# Patient Record
Sex: Male | Born: 1955 | Race: White | Hispanic: No | Smoking: Never smoker
Health system: Southern US, Community
[De-identification: ages and names within clinical notes are randomized; demographics above are authoritative.]

## PROBLEM LIST (undated history)

## (undated) DIAGNOSIS — I10 Essential (primary) hypertension: Secondary | ICD-10-CM

## (undated) DIAGNOSIS — G44209 Tension-type headache, unspecified, not intractable: Secondary | ICD-10-CM

## (undated) DIAGNOSIS — E669 Obesity, unspecified: Secondary | ICD-10-CM

## (undated) DIAGNOSIS — K760 Fatty (change of) liver, not elsewhere classified: Secondary | ICD-10-CM

## (undated) HISTORY — PX: CERVICAL SPINE SURGERY: SHX589

## (undated) HISTORY — DX: Fatty (change of) liver, not elsewhere classified: K76.0

## (undated) HISTORY — DX: Obesity, unspecified: E66.9

## (undated) HISTORY — DX: Tension-type headache, unspecified, not intractable: G44.209

## (undated) HISTORY — PX: BACK SURGERY: SHX140

## (undated) HISTORY — PX: CHOLECYSTECTOMY: SHX55

## (undated) HISTORY — PX: OTHER SURGICAL HISTORY: SHX169

---

## 2005-05-25 ENCOUNTER — Ambulatory Visit: Payer: Self-pay | Admitting: Family Medicine

## 2005-08-21 ENCOUNTER — Emergency Department (HOSPITAL_COMMUNITY): Admission: EM | Admit: 2005-08-21 | Discharge: 2005-08-21 | Payer: Self-pay | Admitting: Emergency Medicine

## 2007-03-07 ENCOUNTER — Emergency Department (HOSPITAL_COMMUNITY): Admission: EM | Admit: 2007-03-07 | Discharge: 2007-03-08 | Payer: Self-pay | Admitting: Emergency Medicine

## 2009-05-20 ENCOUNTER — Ambulatory Visit (HOSPITAL_COMMUNITY): Admission: RE | Admit: 2009-05-20 | Discharge: 2009-05-20 | Payer: Self-pay | Admitting: Internal Medicine

## 2009-06-13 ENCOUNTER — Ambulatory Visit (HOSPITAL_COMMUNITY): Admission: RE | Admit: 2009-06-13 | Discharge: 2009-06-14 | Payer: Self-pay | Admitting: Neurosurgery

## 2009-07-19 ENCOUNTER — Inpatient Hospital Stay (HOSPITAL_COMMUNITY): Admission: RE | Admit: 2009-07-19 | Discharge: 2009-07-21 | Payer: Self-pay | Admitting: Neurosurgery

## 2009-11-08 ENCOUNTER — Encounter: Payer: Self-pay | Admitting: Orthopedic Surgery

## 2009-11-08 ENCOUNTER — Emergency Department (HOSPITAL_COMMUNITY): Admission: EM | Admit: 2009-11-08 | Discharge: 2009-11-08 | Payer: Self-pay | Admitting: Emergency Medicine

## 2009-11-11 ENCOUNTER — Ambulatory Visit: Payer: Self-pay | Admitting: Orthopedic Surgery

## 2009-11-11 ENCOUNTER — Encounter (INDEPENDENT_AMBULATORY_CARE_PROVIDER_SITE_OTHER): Payer: Self-pay | Admitting: *Deleted

## 2009-11-11 DIAGNOSIS — S52023A Displaced fracture of olecranon process without intraarticular extension of unspecified ulna, initial encounter for closed fracture: Secondary | ICD-10-CM | POA: Insufficient documentation

## 2009-11-11 DIAGNOSIS — IMO0002 Reserved for concepts with insufficient information to code with codable children: Secondary | ICD-10-CM | POA: Insufficient documentation

## 2009-11-15 ENCOUNTER — Telehealth: Payer: Self-pay | Admitting: Orthopedic Surgery

## 2009-11-27 ENCOUNTER — Ambulatory Visit: Payer: Self-pay | Admitting: Orthopedic Surgery

## 2010-02-18 ENCOUNTER — Ambulatory Visit: Payer: Self-pay | Admitting: Orthopedic Surgery

## 2010-02-18 DIAGNOSIS — M7020 Olecranon bursitis, unspecified elbow: Secondary | ICD-10-CM | POA: Insufficient documentation

## 2010-03-12 ENCOUNTER — Ambulatory Visit: Payer: Self-pay | Admitting: Orthopedic Surgery

## 2010-09-09 NOTE — Letter (Signed)
Summary: History form  History form   Imported By: Jacklynn Ganong 11/13/2009 09:03:49  _____________________________________________________________________  External Attachment:    Type:   Image     Comment:   External Document

## 2010-09-09 NOTE — Progress Notes (Signed)
Summary: swelling of the elbow  Phone Note Call from Patient Call back at Home Phone [phone]   Summary of Call: called stating he noticed more bruising and swelling, no increase in pain, I advised to take Ibuprofen 600mg  every 8 hrs, he is taking Percocet for pain, use ice, elevation also, advised bruising will go away, this is normal, he is using sling as needed. Initial call taken by: Ether Griffins,  November 15, 2009 10:02 AM

## 2010-09-09 NOTE — Letter (Signed)
Summary: Out of Work  [facility]  [address]  [place], [place] [address]   Phone: [phone]  Fax: [phone]    November 11, 2009   Employee:  Duane Pratt    To Whom It May Concern:   For Medical reasons, please excuse the above named employee from work for the following dates:  Start:   November 11, 2009  End:   Dec 09, 2009  Estimated Return to Work Date:  Dec 10, 2009          (Next appointment:  November 25, 2009)  If you need additional information, please feel free to contact our office.         Sincerely,    Terrance Mass, MD

## 2010-09-09 NOTE — Assessment & Plan Note (Signed)
Summary: still having shoulder pain/frs   Visit Type:  Follow-up Referring Provider:  [facility]  CC:  right elbow pain.  History of Present Illness: 55 year old male status post injury to the RIGHT elbow treated with conservative management presents back for continued RIGHT elbow pain and RIGHT elbow swelling  Also has some LEFT elbow tenderness over the olecranon  The pain is radiating from his RIGHT elbow into her shoulder and into his wrist  His large fluid collection over the RIGHT elbow.   he is status post cervical disc surgery  Pain med is Flexeril 10 and Oxycodone 5 no relief for elbow, takes for back.  examination reveals a large swollen bursal sac with tenderness at the olecranon this is noted on the LEFT side but no bursal sac is noted  He has full range of motion and strength of his RIGHT elbow negative Tinel's over the ulnar nerve the elbow remained stable.  He has no sensory deficits in the RIGHT hand is awake alert and oriented x3 his mood and affect is normal his skin is not read.  He has no lymphadenopathy in the RIGHT side.  He has good pulses perfusion to the RIGHT upper extremity  His overall appearance was normal    Allergies: No Known Drug Allergies   Impression & Recommendations:  Problem # 1:  OLECRANON BURSITIS, RIGHT (ICD-726.33) Assessment New  olecranon bursitis RIGHT elbow  Recommend aspiration injection which was done  The  elbow was prepped with alcohol and anesthetized with ethyl chloride. 40 mg of Depo-Medrol and 5 cc of 1% lidocaine were injected along the lateral epicondyle. No complications. Verbal consent was obtained prior to injection. After aspirating 8 cc of bloody fluid from the RIGHT elbow bursal sac we then performed an injection  Patient's arm was wrapped in an Ace bandage  Orders: Est. Patient Level III ([address]) Joint Aspirate / Injection, Intermediate (60454) Depo- Medrol 40mg  (J1030)  Problem # 2:  CLOSED FRACTURE OF  OLECRANON PROCESS OF ULNA (ICD-813.01) Assessment: Comment Only  resolved  Orders: Est. Patient Level III ([address]11)  Patient Instructions: 1)  You have received an injection of cortisone today. You may experience increased pain at the injection site. Apply ice pack to the area for 20 minutes every 2 hours and take 2 xtra strength tylenol every 8 hours. This increased pain will usually resolve in 24 hours. The injection will take effect in 3-10 days.   2)  wear ace wrap for 72 hours  3)  try to keep pressure off the elbows 4)  Please schedule a follow-up appointment in 3 weeks.

## 2010-09-09 NOTE — Assessment & Plan Note (Signed)
Summary: 2 WK RE-CK RT ELBOW/?XRAY/[facility]   Visit Type:  Follow-up Referring Provider:  [facility]  CC:  right elbow.  History of Present Illness: I saw Duane Pratt in the office today for a 2 week  followup visit.  He is a 55 years old man with the complaint of:  RIGHT elbow injury.  He fractured the olecranon spur.  Had a lot of bruising and swelling.  That is now a lot better.  He says he is 90% of his movement back.  His other complaints include pain and radiation into the RIGHT shoulder when he sneezes otherwise shoulder is normal.  He is status post cervical spine surgery and is in the rehabilitation.  For that  He also asked me about his LEFT elbow as it has a spur and it started to bother him a little bit as well  Exam shows he has full extension.  He has grade 5 strength in extension for the triceps on the RIGHT.  His swelling and skin is a lot better.  He has full flexion.  His elbow is stable.  His LEFT elbow as a tender spur there as well  He has full range of motion of the shoulder mild impingement sign and good strength in the supraspinatus tendon.  Impression fracture of olecranon spur RIGHT elbow, spur LEFT elbow, mild impingement RIGHT shoulder, status post cervical fusion.    Allergies: No Known Drug Allergies   Impression & Recommendations:  Problem # 1:  CLOSED FRACTURE OF OLECRANON PROCESS OF ULNA (ICD-813.01) Assessment Improved  Orders: Est. Patient Level III (60630)  Patient Instructions: 1)  Remove sling and ace wrap  2)  continue exercises for the elbow  3)  f/u as needed; 4)   use ice for swelling

## 2010-09-09 NOTE — Assessment & Plan Note (Signed)
Summary: ER/fx elbow/frs   Vital Signs:  Patient profile:   55 year old male Pulse rate:   88 / minute Resp:     16 per minute  Visit Type:  Initial Consult Referring Provider:  [facility]  CC:  fracture right elbow.  History of Present Illness: I saw Duane Pratt in the office today for an initial visit.  He is a 55 years old man with the complaint of:  fracture right elbow.  ER 11-08-09. Xrays were taken.  DOI 11-08-09.  The patient has had neck and back surgery in the last 6 or 8 months.  He went to look at a treadmill return to treadmill on it when on full speed and he twisted his elbow never fell.  He has olecranon spur fracture and probably a sprain to his lateral collateral ligament space on the amount of swelling he has in the arm with a relatively normal-looking x-ray except for the soft tissue swelling and the fracture line along the olecranon spur  His pain is 8/10 it happened suddenly associated with bruising swelling and sharp. Medications: Aspirin, Vitamin C, Vitamin E, Percocet 5 given by the ER.   Allergies (verified): No Known Drug Allergies  Past History:  Past Surgical History: Cholecystectomy Back surgery Neck surgery  Review of Systems Constitutional:  Denies weight loss, weight gain, fever, chills, and fatigue. Cardiovascular:  Denies chest pain, palpitations, fainting, and murmurs. Respiratory:  Denies short of breath, wheezing, couch, tightness, pain on inspiration, and snoring . Gastrointestinal:  Denies heartburn, nausea, vomiting, diarrhea, constipation, and blood in your stools. Genitourinary:  Denies frequency, urgency, difficulty urinating, painful urination, flank pain, and bleeding in urine. Neurologic:  Complains of numbness and tingling; denies unsteady gait, dizziness, tremors, and seizure. Musculoskeletal:  Complains of joint pain, swelling, and muscle pain; denies instability, stiffness, redness, and heat. Endocrine:  Denies excessive  thirst, exessive urination, and heat or cold intolerance. Psychiatric:  Denies nervousness, depression, anxiety, and hallucinations. Skin:  Complains of changes in the skin; denies poor healing, rash, itching, and redness. HEENT:  Denies blurred or double vision, eye pain, redness, and watering. Immunology:  Denies seasonal allergies, sinus problems, and allergic to bee stings. Hemoatologic:  Denies easy bleeding and brusing.  Physical Exam  Additional Exam:  vital signs are normal.  Gen. appearance of the patient was normal.  Pulses and perfusion in the RIGHT upper extremity normal  Skin was warm dry and intact no rashes but significant ecchymosis in the brachial area as well as the forearm.  Sensation was normal he was awake and alert his mood is excellent.  There is tenderness on the lateral and posterior sides of the elbow no medial tenderness.  His range of motion was 45.  The elbow was stable as could best be tested motor exam was normal    Impression & Recommendations:  Problem # 1:  ELBOW SPRAIN, RIGHT (ICD-841.9) Assessment New  lateral ligament sprain  Orders: New Patient Level II (84132)  Problem # 2:  CLOSED FRACTURE OF OLECRANON PROCESS OF ULNA (ICD-813.01) Assessment: New  this fracture is really of the olecranon spur  Orders: New Patient Level II (44010)  Patient Instructions: 1)  WRAP ACE WRAP ON THE ELBOW FOR 2 WEEKS  2)  SLING AS NEEDED  3)  25 X 3 TIMES A DAY MOVE THE ELBOW  4)  RETURN IN 2 WEEKS

## 2010-09-09 NOTE — Assessment & Plan Note (Signed)
Summary: 3 WK RE-CK/ ELBOW/FOL'G INJEC/[facility]   Visit Type:  Follow-up Referring Provider:  [facility]  CC:  recheck elbow..  History of Present Illness: 55 year old male status post injury to the RIGHT elbow treated with conservative management presents back for continued RIGHT elbow pain and RIGHT elbow swelling  Also has some LEFT elbow tenderness over the olecranon  The pain is radiating from his RIGHT elbow into the  shoulder and into his wrist  Has large fluid collection over the RIGHT elbow.  he is status post cervical disc surgery  Pain med is Endocet 10/325 for back pain.   Today is 3 week recheck on elbow after injection and ace wrap, doing better.     Allergies: No Known Drug Allergies  Physical Exam  Additional Exam:  last examination  examination reveals a large swollen bursal sac with tenderness at the olecranon this is noted on the LEFT side but no bursal sac is noted  He has full range of motion and strength of his RIGHT elbow negative Tinel's over the ulnar nerve the elbow remained stable.  He has no sensory deficits in the RIGHT hand is awake alert and oriented x3 his mood and affect is normal his skin is not read.  He has no lymphadenopathy in the RIGHT side.  He has good pulses perfusion to the RIGHT upper extremity   Today's examination reveals tenderness directly over the RIGHT biceps tendon insertion bursal sac is resolved good confrontational strength in extension   Impression & Recommendations:  Problem # 1:  OLECRANON BURSITIS, RIGHT (ICD-726.33) Assessment Improved  Orders: Est. Patient Level II ([address])  Patient Instructions: 1)  apply ASPERCREME  to right elbow three times a day  2)  Please schedule a follow-up appointment as needed.

## 2010-09-22 ENCOUNTER — Ambulatory Visit (INDEPENDENT_AMBULATORY_CARE_PROVIDER_SITE_OTHER): Payer: Self-pay | Admitting: Internal Medicine

## 2010-11-11 LAB — CBC
MCHC: 34.3 g/dL (ref 30.0–36.0)
RBC: 5.17 MIL/uL (ref 4.22–5.81)
RDW: 13.3 % (ref 11.5–15.5)

## 2010-11-11 LAB — BASIC METABOLIC PANEL
BUN: 13 mg/dL (ref 6–23)
Calcium: 9.7 mg/dL (ref 8.4–10.5)
Creatinine, Ser: 0.74 mg/dL (ref 0.4–1.5)
GFR calc non Af Amer: >60 mL/min (ref >60)
Glucose, Bld: 99 mg/dL (ref 70–99)
Potassium: 4.2 mEq/L (ref 3.5–5.1)

## 2010-11-11 LAB — TYPE AND SCREEN
ABO/RH(D): A POS
Antibody Screen: NEGATIVE

## 2010-11-12 LAB — COMPREHENSIVE METABOLIC PANEL
Alkaline Phosphatase: 61 U/L (ref 39–117)
BUN: 26 mg/dL — ABNORMAL HIGH (ref 6–23)
Calcium: 9 mg/dL (ref 8.4–10.5)
Chloride: 97 mEq/L (ref 96–112)
Creatinine, Ser: 0.85 mg/dL (ref 0.4–1.5)
GFR calc Af Amer: >60 mL/min (ref >60)
GFR calc non Af Amer: >60 mL/min (ref >60)
Glucose, Bld: 93 mg/dL (ref 70–99)

## 2010-11-12 LAB — CBC
MCHC: 35.1 g/dL (ref 30.0–36.0)
WBC: 6.8 10*3/uL (ref 4.0–10.5)

## 2011-01-26 ENCOUNTER — Ambulatory Visit (HOSPITAL_COMMUNITY)
Admission: RE | Admit: 2011-01-26 | Discharge: 2011-01-26 | Disposition: A | Discharge status: Home / Self Care | Payer: 59 | Source: Ambulatory Visit | Attending: Internal Medicine | Admitting: Internal Medicine

## 2011-01-26 ENCOUNTER — Other Ambulatory Visit (HOSPITAL_COMMUNITY): Payer: Self-pay | Admitting: Internal Medicine

## 2011-01-26 DIAGNOSIS — R51 Headache: Secondary | ICD-10-CM | POA: Insufficient documentation

## 2011-01-26 DIAGNOSIS — R4789 Other speech disturbances: Secondary | ICD-10-CM | POA: Insufficient documentation

## 2011-01-26 DIAGNOSIS — R42 Dizziness and giddiness: Secondary | ICD-10-CM | POA: Insufficient documentation

## 2011-04-05 IMAGING — RF DG LUMBAR SPINE 2-3V
1 series · 2 of 2 positions shown · non-contrast
Comparison: Intraoperative localizing lateral lumbar spine earlier
in the [AGE] hours. MRI lumbar spine 05/20/2009.

CLINICAL DATA: L5-S1 spondylolisthesis.  PLIF.

INTRAOPERATIVE LUMBAR SPINE - 2-3 VIEW [DATE]/4222 2244 hours:

[Series 1: run · 2 of 2 slices shown]
[im 1/2]
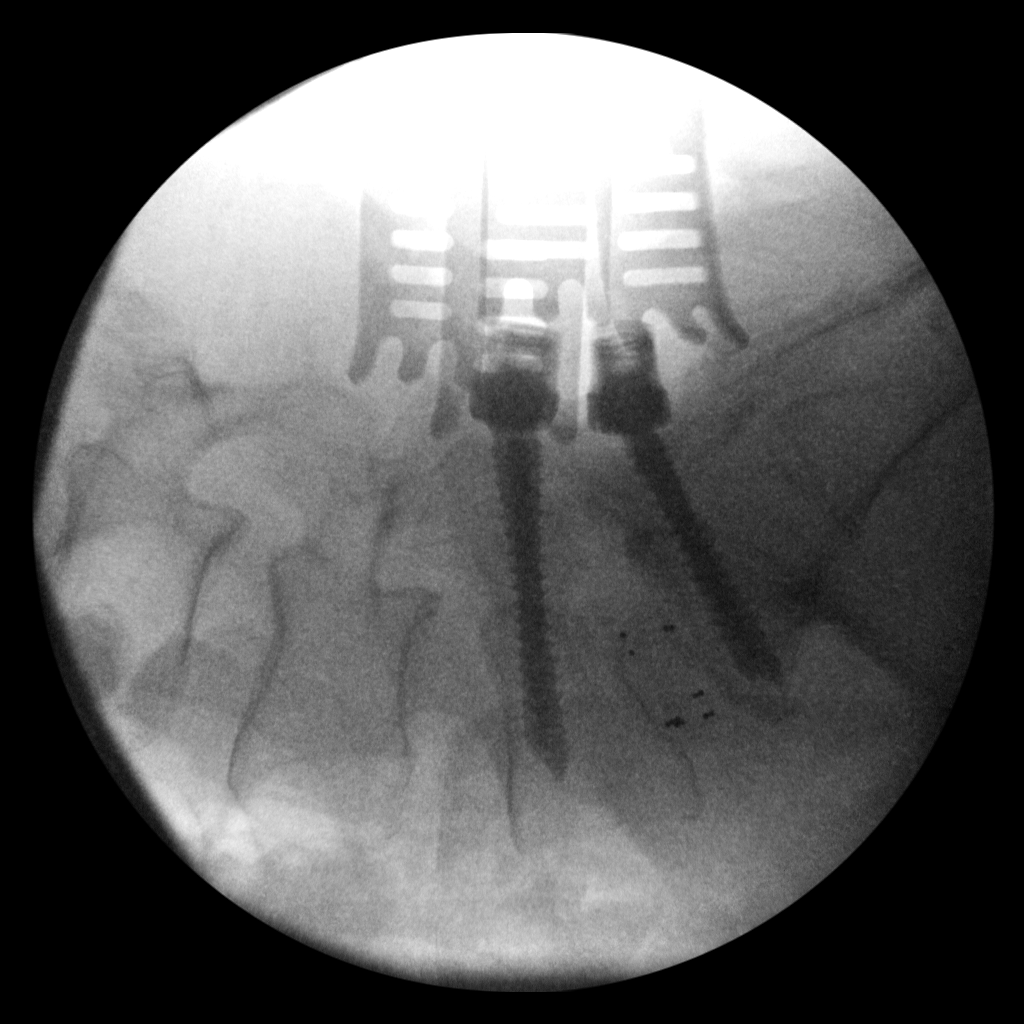
[im 2/2]
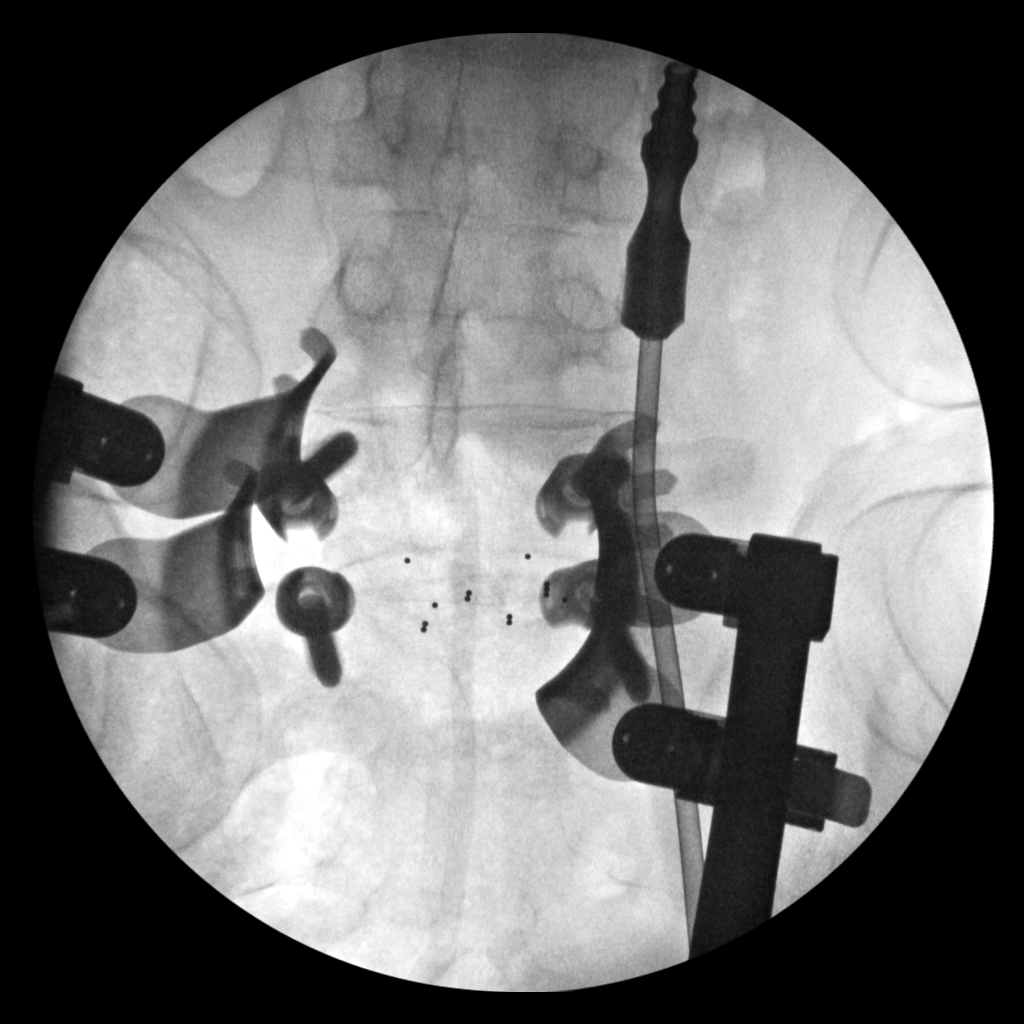

[2 of 2 positions shown; findings below may reference images not displayed]

FINDINGS: 2 spot images from the C-arm fluoroscopic device, AP and
lateral views of the lower lumbar spine, were submitted for
interpretation post-operatively.  Bilateral pedicle screws are
present at L5 and S1.  Interbody fusion plugs are in appropriate
position at L5-S1.
IMPRESSION: Images obtained during L5-S1 PLIF without visible complicating
features.

## 2011-04-06 ENCOUNTER — Encounter: Payer: Self-pay | Admitting: Emergency Medicine

## 2011-04-06 ENCOUNTER — Emergency Department (HOSPITAL_COMMUNITY)
Admission: EM | Admit: 2011-04-06 | Discharge: 2011-04-06 | Disposition: A | Discharge status: Home / Self Care | Payer: 59 | Attending: Emergency Medicine | Admitting: Emergency Medicine

## 2011-04-06 DIAGNOSIS — I10 Essential (primary) hypertension: Secondary | ICD-10-CM | POA: Insufficient documentation

## 2011-04-06 DIAGNOSIS — R51 Headache: Secondary | ICD-10-CM | POA: Insufficient documentation

## 2011-04-06 HISTORY — DX: Essential (primary) hypertension: I10

## 2011-04-06 NOTE — ED Notes (Signed)
Pt says he has hard pressure off and on for a few weeks, said he advised physician at headache clinic but got scared and came to the ED.  Pt says the pressure has increased dramatically in the last 48 hours.  Pt has had a headache for the last 3 months and says he loses his ability to speak clearly when the pressure increases.  Pt says pressure increases when he lays down.  Says the pressure is worse now while his head is touching the bed.

## 2011-04-06 NOTE — ED Provider Notes (Signed)
History     CSN: [id number] Arrival date & time: 04/06/2011  4:05 AM Seen at 0420am Chief Complaint  Patient presents with  . Headache    Pt is being seen at the headache clinic, says he now has increased pressure in back of head and neck.   HPI Comments: Several month h/o HA, seen at headache clinic, has had recent CT imaging Reports he gets facial/scalp "injections" at headaches clinic Reports HA has been constant for months Reports worsened tonight, and wanted to be evaluated to see if any further testing is necessary No fever No falls No tick bites  Patient is a 55 y.o. male presenting with headaches. The history is provided by the patient.  Headache  This is a chronic problem. Episode onset: several months ago. The problem occurs constantly. The problem has been gradually worsening. Pain location: throughout his head, and pressure from his neck. The pain is moderate. Associated symptoms include nausea. Pertinent negatives include no fever, no chest pressure, no palpitations, no syncope, no shortness of breath and no vomiting. Associated symptoms comments: No visual changes No hearing loss No vertigo No focal weakness No falls No cp/sob Reports when pressure worsens, he has difficulty speaking but this has been present for weeks and has seen his physician for this.    Past Medical History  Diagnosis Date  . Hypertension     Past Surgical History  Procedure Date  . Back surgery   . Cholecystectomy     History reviewed. No pertinent family history.  History  Substance Use Topics  . Smoking status: Not on file  . Smokeless tobacco: Never Used  . Alcohol Use: No      Review of Systems  Constitutional: Negative for fever.  Respiratory: Negative for shortness of breath.   Cardiovascular: Negative for palpitations and syncope.  Gastrointestinal: Positive for nausea. Negative for vomiting.  Neurological: Positive for headaches.  All other systems reviewed and are  negative.    Physical Exam  BP 148/107  Pulse 98  Temp(Src) 97.8 F (36.6 C) (Oral)  Resp 14  Ht 5\' 11"  (1.803 m)  Wt 210 lb (95.255 kg)  BMI 29.29 kg/m2  SpO2 97%  Physical Exam  CONSTITUTIONAL: Well developed/well nourished HEAD AND FACE: Normocephalic/atraumatic EYES: EOMI/PERRL, normal appearance ENMT: Mucous membranes moist NECK: supple no meningeal signs, no carotid bruit CV: S1/S2 noted, no murmurs/rubs/gallops noted LUNGS: Lungs are clear to auscultation bilaterally, no apparent distress ABDOMEN: soft, nontender, no rebound or guarding NEURO: Awake/alert, facies symmetric, no arm or leg drift is noted Cranial nerves 3/4/5/6/02/15/09/11/12 tested and intact No ataxia No pastpointing No nystagmus EXTREMITIES: pulses normal, full ROM SKIN: warm, color normal PSYCH: no abnormalities of mood noted   ED Course  Procedures  MDM Nursing notes reviewed and considered in documentation Previous records reviewed and considered  I offered pt medicines.  He refuses, reports his physician instructed him not to take any other meds.  He will f/u later today with his specialist.  He would like to go home.  No further workup necessary given history of HA for months No dysarthria is noted on my exam, speech clear     Joya Gaskins, MD 04/06/11 [phone]

## 2011-04-07 ENCOUNTER — Other Ambulatory Visit: Payer: Self-pay | Admitting: Specialist

## 2011-04-07 DIAGNOSIS — R519 Headache, unspecified: Secondary | ICD-10-CM

## 2011-04-08 ENCOUNTER — Other Ambulatory Visit: Payer: Self-pay | Admitting: Specialist

## 2011-04-08 DIAGNOSIS — R519 Headache, unspecified: Secondary | ICD-10-CM

## 2011-04-14 ENCOUNTER — Ambulatory Visit
Admission: RE | Admit: 2011-04-14 | Discharge: 2011-04-14 | Disposition: A | Discharge status: Home / Self Care | Payer: 59 | Source: Ambulatory Visit | Attending: Specialist | Admitting: Specialist

## 2011-04-14 ENCOUNTER — Other Ambulatory Visit: Payer: Self-pay | Admitting: Specialist

## 2011-04-14 VITALS — BP 125/73 | HR 86

## 2011-04-14 DIAGNOSIS — R519 Headache, unspecified: Secondary | ICD-10-CM

## 2011-04-14 NOTE — Progress Notes (Signed)
Two tiger-top tubes of blood drawn from right [facility] space w/o difficulty for 4ml serum per LP orders.  jkl

## 2011-05-13 LAB — FUNGUS CULTURE W SMEAR: Smear Result: NONE SEEN

## 2011-05-25 LAB — URINALYSIS, ROUTINE W REFLEX MICROSCOPIC
Hgb urine dipstick: NEGATIVE
Ketones, ur: NEGATIVE
Leukocytes, UA: NEGATIVE
Protein, ur: 30 — AB
Urobilinogen, UA: 0.2

## 2011-05-25 LAB — DIFFERENTIAL
Basophils Absolute: 0
Basophils Relative: 0
Eosinophils Absolute: 0.1
Neutro Abs: 6
Neutrophils Relative %: 73

## 2011-05-25 LAB — COMPREHENSIVE METABOLIC PANEL
Alkaline Phosphatase: 64
BUN: 16
CO2: 29
Chloride: 100
Glucose, Bld: 141 — ABNORMAL HIGH
Potassium: 4.3
Total Bilirubin: 1

## 2011-05-25 LAB — URINE MICROSCOPIC-ADD ON

## 2011-05-25 LAB — CBC
HCT: 45.5
Hemoglobin: 15.6
RBC: 5.36
WBC: 8.3

## 2011-05-25 LAB — POCT CARDIAC MARKERS: Troponin i, poc: <0.05

## 2012-12-12 ENCOUNTER — Ambulatory Visit (INDEPENDENT_AMBULATORY_CARE_PROVIDER_SITE_OTHER): Payer: BC Managed Care – PPO | Admitting: Neurology

## 2012-12-12 ENCOUNTER — Encounter: Payer: Self-pay | Admitting: Neurology

## 2012-12-12 VITALS — BP 129/89 | HR 97 | Temp 97.6°F | Ht 70.5 in | Wt 254.0 lb

## 2012-12-12 DIAGNOSIS — R1310 Dysphagia, unspecified: Secondary | ICD-10-CM

## 2012-12-12 DIAGNOSIS — M62838 Other muscle spasm: Secondary | ICD-10-CM

## 2012-12-12 DIAGNOSIS — R259 Unspecified abnormal involuntary movements: Secondary | ICD-10-CM

## 2012-12-12 DIAGNOSIS — R51 Headache: Secondary | ICD-10-CM

## 2012-12-12 MED ORDER — BUSPIRONE HCL 15 MG PO TABS: 15.0000 mg | ORAL_TABLET | Freq: Three times a day (TID) | ORAL | Status: AC

## 2012-12-12 NOTE — Progress Notes (Addendum)
Subjective:    Patient ID: Duane Pratt is a 57 y.o. male.  HPI  Interim history:  Duane Pratt is a 57 yo RH gentleman, who presents for FU consultation of abnormal involuntary facial movements, including twitching and abnormal eye closure intermittently. The patient is unaccompanied today and was last seen by me on 09/14/12, at which time I talked to him about his recent sleep study and I suggested a trial of Buspar to reduce the associated anxiety.  I first met him on 08/29/12, and I had suggested a NPSG, as he reported waking up multiple times in the middle of the night secondary to the abnormal facial spasms. He was not sure if the movements subside completely when he is deeply asleep. He had a sleep study on 09/03/12 and I went over his test results with him in detail at our last visit: He fell asleep and 7-1/2 minutes and the sleep efficiency was mildly reduced at 86%. He did have an increased percentage of stage II sleep, and decreased percentage of slow-wave sleep and a fairly normal percentage of REM sleep. His REM latency was mildly prolonged. He had mild intermittent snoring. He had no obstructive apneas and had some obstructive hypopneas. Overall his AHI was 5.5 per hour rendering borderline obstructive sleep apnea, his REM related AHI was 22 per hour rendering REM related OSA. He did not have any abnormal involuntary movements in sleep. I did not see that his muscle twitching woke him up. He did however have abnormal facial twitching that was evident especially on to EMG during periods of wakefulness. He has seen multiple neurologists in the past.  He has been on Cymbalta at 60 mg since 09/03/12 and has noticed no improvement with the movements, but his mood improved. In the past, he has been on trials of amitriptyline, nortriptyline, Artane, valium, Klonopin without success. He has been reluctant to pursue Botox injections. I had checked TSH, HBA1c, ANA, RPR, all unremarkable and I told him  about his labs as well.  He reports worse Sx of eye closure in the last 2 weeks and the Buspar helped in the first few weeks.  In the past, he had seen neurologists at [facility] and [facility]. His Sx have been ongoing for over a year. He drives minimally, but still drives. He is more depressed and tearful today. He denies any suicidal or homicidal ideations.  His Past Medical History Is Significant For: Past Medical History  Diagnosis Date  . Hypertension   . Tension headache   . Obesity   . Fatty liver     His Past Surgical History Is Significant For: Past Surgical History  Procedure Laterality Date  . Back surgery    . Cholecystectomy    . Lumosacral spine    . Cervical spine surgery      His Family History Is Significant For: No family history on file.  His Social History Is Significant For: History   Social History  . Marital Status: Divorced    Spouse Name: N/A    Number of Children: N/A  . Years of Education: N/A   Social History Main Topics  . Smoking status: Never Smoker   . Smokeless tobacco: Never Used  . Alcohol Use: Yes  . Drug Use: No  . Sexually Active: No   Other Topics Concern  . Not on file   Social History Narrative  . No narrative on file    His Allergies Are:  No Known Allergies:  His Current Medications Are:  Outpatient Encounter Prescriptions as of 12/12/2012  Medication Sig Dispense Refill  . amLODipine (NORVASC) 5 MG tablet Take 5 mg by mouth daily.      . Ascorbic Acid (VITAMIN C) 1000 MG tablet Take 1,000 mg by mouth daily.        Marland Kitchen aspirin 81 MG tablet Take 81 mg by mouth daily.        . busPIRone (BUSPAR) 15 MG tablet Take 15 mg by mouth 2 (two) times daily.      . cholecalciferol (VITAMIN D) 1000 UNITS tablet Take 1,000 Units by mouth daily.        Marland Kitchen co-enzyme Q-10 30 MG capsule Take 100 mg by mouth 3 (three) times daily.        . DULoxetine (CYMBALTA) 60 MG capsule Take 60 mg by mouth daily.      . irbesartan-hydrochlorothiazide  (AVALIDE) 300-12.5 MG per tablet Take 1 tablet by mouth daily.      Marland Kitchen LORazepam (ATIVAN) 1 MG tablet Take 1 mg by mouth every 8 (eight) hours.      . meloxicam (MOBIC) 15 MG tablet Take 15 mg by mouth daily.      . vitamin E (VITAMIN E) 400 UNIT capsule Take 400 Units by mouth daily.        . [DISCONTINUED] baclofen (LIORESAL) 10 MG tablet Take 10 mg by mouth 3 (three) times daily.        . [DISCONTINUED] olmesartan (BENICAR) 20 MG tablet Take 20 mg by mouth daily.        . [DISCONTINUED] topiramate (TOPAMAX) 100 MG tablet Take 100 mg by mouth 2 (two) times daily.         No facility-administered encounter medications on file as of 12/12/2012.  : Review of Systems  Constitutional: Positive for fatigue.       Weight gain  HENT:       Ringing in ear,joint pain ,cramping.  Eyes:       Blurred vision  Neurological:       Slurred speech,diffculity swallowing   Hematological:       Easy bruising,easy bleeding    Objective:  Neurologic Exam  Physical Exam Physical Examination:   Filed Vitals:   12/12/12 1216  BP: 129/89  Pulse: 97  Temp: 97.6 F (36.4 C)    General Examination: The patient is a very pleasant 57 y.o. male in no acute distress. He appears well-developed and well-nourished and adequately groomed.   HEENT: HEENT exam reveals intermittent facial twitching and abnormal mouth movements at times. He has intermittent periods of eye closure of one side or the other and intermittent tightening of his platysma. He also has excessive blinking without sustained eye closure or apraxia of eyelid opening. Hearing is intact. Neck is supple with full range of motion. Speech is at times difficult to understand. This is unchanged from before.  Chest: Clear to auscultation without wheezing, rhonchi or crackles noted.  Heart: S1+S2+0, regular and normal without murmurs, rubs or gallops noted.   Abdomen: Soft, non-tender and non-distended with normal bowel sounds appreciated on  auscultation.  Extremities: There is no pitting edema in the distal lower extremities bilaterally. Pedal pulses are intact.  Skin: Warm and dry without trophic changes noted. There are no varicose veins.  Musculoskeletal: exam reveals no obvious joint deformities, tenderness or joint swelling or erythema.   Neurologically:  Mental status: The patient is awake, alert and oriented in all 4 spheres. His memory,  attention, language and knowledge are appropriate. There is no aphasia, agnosia, apraxia or anomia. Speech is clear with normal prosody and enunciation. Thought process is linear. Mood is depressed and tearful and affect is labile.  Cranial nerves are as described above under HEENT exam. In addition, shoulder shrug is normal with equal shoulder height noted. Motor exam: Normal bulk, strength and tone is noted. There is no drift, tremor or rebound. Romberg is negative. Reflexes are 2+ throughout. Toes are downgoing bilaterally. Fine motor skills are intact with normal finger taps, normal hand movements, normal rapid alternating patting, normal foot taps and normal foot agility.  Cerebellar testing shows no dysmetria or intention tremor on finger to nose testing. Heel to shin is unremarkable bilaterally. There is no truncal or gait ataxia.  Sensory exam is intact to light touch, pinprick, vibration, temperature sense and proprioception in the upper and lower extremities.  Gait, station and balance are unremarkable. No veering to one side is noted. No leaning to one side is noted. Posture is age-appropriate and stance is narrow based. No problems turning are noted. He turns en bloc. Tandem walk is unremarkable. Intact toe and heel stance is noted. He has no other abnormal involuntary movements such as dyskinesias, athetosis, myoclonus.              Assessment and Plan:    In summary, Duane Pratt is a 57 yo RH gentleman with an over 15 month Hx of abnormal involuntary movements affecting his face  and eyes. His recent sleep study did not show any abnormal movements in sleep and I had discussed it last time with him. His exam is essentially unchanged today. He has previously had evaluations at [facility] and [facility] and was told in the past to see a psychiatrist which he had and he was told by the psychiatrist that this was "not in his head". Today I suggested an increase in BuSpar today, to 15 mg 3 times a day as he believes that initially it did help especially to reduce his anxiety level but recently in the last 2 weeks his anxiety has flared up again. I would also like to do a swallow study at this time since he is having trouble swallowing at times. I would like to reevaluate him in 3 months and I've encouraged him to call with any questions or concerns or problems that may arise in the interim. He was in agreement.  Addendum: Please note that the patient called on 01/12/2013 stating that he has not had an alcoholic drink in 25 years. Erroneously, it states on the social history that he does consume alcohol.

## 2012-12-12 NOTE — Patient Instructions (Addendum)
  I do have some suggestions for you today:   Please Talk to Dr. Margo Aye about increasing your Cymbalta. I would like to suggest an increase in Buspar to 15 mg three times a day and please keep your Eye appt.   Let's do a swallow study.   Please make sure that you drink plenty of fluids. I would like for you to exercise daily for example in the form of walking 20-30 minutes every day, if you can. Please keep a regular sleep-wake schedule, keep regular meal times, do not skip any meals, eat  healthy snacks in between meals, such as fruit or nuts. Try to eat protein with every meal.   Engage in social activities in your community and with your family and try to keep up with current events by reading the newspaper or watching the news.  I do not think we need to make any changes in your medications at this point. I think you're stable enough that I can see you back in 6 months, sooner if we need to. Please call us if you have any interim questions, concerns, or problems or updates to need to discuss.  Please also call us for any test results so we can go over those with you on the phone. Brett Canales is my clinical assistant and will answer any of your questions and relay your messages to me and will give you my messages.   Our phone number is [phone]. We also have an after hours call service for urgent matters and there is a physician on-call for urgent questions. For any emergencies you know to call 911 or go to the nearest emergency room.

## 2013-01-12 ENCOUNTER — Telehealth: Payer: Self-pay | Admitting: *Deleted

## 2013-01-12 NOTE — Telephone Encounter (Signed)
I called wk and he had already left for the day.   I called and LMVM for home that returned his call.

## 2013-01-13 NOTE — Telephone Encounter (Signed)
Pt called and noting from his record that he drinks alcohol and this he says is not correct he has not had a drink for 25 yrs.  Please change.   Note sent to Dr. Frances Furbish.

## 2013-01-20 ENCOUNTER — Other Ambulatory Visit: Payer: Self-pay | Admitting: Neurology

## 2013-01-20 DIAGNOSIS — R1310 Dysphagia, unspecified: Secondary | ICD-10-CM

## 2013-01-26 ENCOUNTER — Telehealth: Payer: Self-pay | Admitting: *Deleted

## 2013-01-26 NOTE — Telephone Encounter (Signed)
Spoke with pt and informed him that I reordered his Swallow Test on 01-20-13; if he doesn't here from somebody to schedule this by 02-01-13, he should call us back.

## 2013-02-08 ENCOUNTER — Telehealth: Payer: Self-pay | Admitting: Neurology

## 2013-02-09 NOTE — Telephone Encounter (Signed)
Given to Sandy P in referrals. 

## 2013-03-06 ENCOUNTER — Telehealth: Payer: Self-pay | Admitting: Neurology

## 2013-03-07 ENCOUNTER — Other Ambulatory Visit (HOSPITAL_COMMUNITY): Payer: Self-pay | Admitting: Neurology

## 2013-03-07 DIAGNOSIS — R1310 Dysphagia, unspecified: Secondary | ICD-10-CM

## 2013-03-07 NOTE — Telephone Encounter (Signed)
Order redone, for SLP to do modified barium swallow at [facility]  They will call pt to schedule.

## 2013-03-07 NOTE — Addendum Note (Signed)
Addended byHermenia Fiscal on: 03/07/2013 09:09 AM   Modules accepted: Orders

## 2013-03-07 NOTE — Addendum Note (Signed)
Addended byHermenia Fiscal on: 03/07/2013 09:36 AM   Modules accepted: Orders

## 2013-03-10 ENCOUNTER — Ambulatory Visit (HOSPITAL_COMMUNITY)
Admission: RE | Admit: 2013-03-10 | Discharge: 2013-03-10 | Disposition: A | Discharge status: Home / Self Care | Payer: BC Managed Care – PPO | Source: Ambulatory Visit | Attending: Neurology | Admitting: Neurology

## 2013-03-10 DIAGNOSIS — I10 Essential (primary) hypertension: Secondary | ICD-10-CM | POA: Insufficient documentation

## 2013-03-10 DIAGNOSIS — R1310 Dysphagia, unspecified: Secondary | ICD-10-CM | POA: Insufficient documentation

## 2013-03-10 DIAGNOSIS — G44209 Tension-type headache, unspecified, not intractable: Secondary | ICD-10-CM | POA: Insufficient documentation

## 2013-03-10 DIAGNOSIS — K7689 Other specified diseases of liver: Secondary | ICD-10-CM | POA: Insufficient documentation

## 2013-03-10 DIAGNOSIS — E669 Obesity, unspecified: Secondary | ICD-10-CM | POA: Insufficient documentation

## 2013-03-10 NOTE — Procedures (Signed)
Objective Swallowing Evaluation: Modified Barium Swallowing Study  Patient Details  Name: Duane Pratt MRN: [id number] Date of Birth: Dec 28, 1955  Today's Date: 03/10/2013 Time: 4098-1191 SLP Time Calculation (min): 20 min  Past Medical History:  Past Medical History  Diagnosis Date  . Hypertension   . Tension headache   . Obesity   . Fatty liver    Past Surgical History:  Past Surgical History  Procedure Laterality Date  . Back surgery    . Cholecystectomy    . Lumosacral spine    . Cervical spine surgery     HPI:  Pt is an 57 year old male arriving for an outpatient MBS. He reports facial and neck spasms for the past 2 years since having injections of steriods for headaches. He has seen several neurologists for treatment. His primary concern today is a sensation of pills getting stuck. He has not had pna and is able to consume regular solids and liquids otherwise.      Assessment / Plan / Recommendation Clinical Impression  Dysphagia Diagnosis: Within Functional Limits Clinical impression: Pt presents with a normal oral and oropharyngeal swallow. He was observed to struggle to orally propel pill to base of tongue for pharyngeal transit when pill given with liquids. SLP offered pill in puree bolus a second time, which the pt transited without difficutly. Otherwise, function normal despite oral spasms. Pt recommended to consume regular diet and thin liquids with pills whoel in puree. No SLP f/u needed.     Treatment Recommendation  No treatment recommended at this time    Diet Recommendation Regular;Thin liquid   Liquid Administration via: Cup;Straw Medication Administration: Whole meds with puree Supervision: Patient able to self feed    Other  Recommendations Oral Care Recommendations: Patient independent with oral care   Follow Up Recommendations  None    Frequency and Duration        Pertinent Vitals/Pain NA    SLP Swallow Goals     General HPI: Pt is an 57  year old male arriving for an outpatient MBS. He reports facial and neck spasms for the past 2 years since having injections of steriods for headaches. He has seen several neurologist for treatment. His primary concern today is a sensation of pills getting stuck. He has not had pna and is able to consume regular solids and liquids otherwise.  Type of Study: Modified Barium Swallowing Study Reason for Referral: Objectively evaluate swallowing function Diet Prior to this Study: Regular;Thin liquids Temperature Spikes Noted: N/A Respiratory Status: Room air History of Recent Intubation: No Behavior/Cognition: Alert;Cooperative;Pleasant mood Oral Cavity - Dentition: Adequate natural dentition Oral Motor / Sensory Function: Within functional limits Self-Feeding Abilities: Able to feed self Patient Positioning: Upright in chair Baseline Vocal Quality: Clear Volitional Cough: Strong Volitional Swallow: Able to elicit Anatomy: Within functional limits Pharyngeal Secretions: Not observed secondary MBS    Reason for Referral Objectively evaluate swallowing function   Oral Phase Oral Preparation/Oral Phase Oral Phase: Impaired Oral - Thin Oral - Thin Cup: Within functional limits Oral - Thin Straw: Within functional limits Oral - Solids Oral - Puree: Within functional limits Oral - Regular: Within functional limits Oral - Pill: Reduced posterior propulsion;Piecemeal swallowing;Delayed oral transit Oral Phase - Comment Oral Phase - Comment: pill given a second time, whole in puree, no difficulties.    Pharyngeal Phase Pharyngeal Phase Pharyngeal Phase: Within functional limits  Cervical Esophageal Phase    GO         Functional  Assessment Tool Used: clinical judgement Functional Limitations: Swallowing Swallow Current Status (B1478): 0 percent impaired, limited or restricted Swallow Goal Status (G9562): 0 percent impaired, limited or restricted Swallow Discharge Status [phone]): 0  percent impaired, limited or restricted    Malakhai Beitler, Riley Nearing 03/10/2013, 2:21 PM

## 2013-03-10 NOTE — Progress Notes (Signed)
Quick Note:  Left a message on the pt's home voice mail regarding his recent swallow test being within normal limits and what his diet restrictions are. Contact information was given so that he may call with any questions or concerns.   ______

## 2013-03-10 NOTE — Progress Notes (Signed)
Quick Note:  Please call patient and advise him that his recent swallow evaluation and swallow test with the speech and language pathologist was within normal limits and they have recommended no restrictions in his diet or the type of fluids he can take. They did not recommend any followup with speech therapy. He can continue to consume regular consistencies as far as his diet and thin liquids as far as his fluid intake goes.  Huston Foley, MD, PhD [facility] ([facility]  ______

## 2013-03-14 ENCOUNTER — Encounter: Payer: Self-pay | Admitting: Neurology

## 2013-03-14 ENCOUNTER — Ambulatory Visit (INDEPENDENT_AMBULATORY_CARE_PROVIDER_SITE_OTHER): Payer: BC Managed Care – PPO | Admitting: Neurology

## 2013-03-14 VITALS — BP 128/87 | HR 98 | Temp 97.4°F | Ht 70.5 in | Wt 258.0 lb

## 2013-03-14 DIAGNOSIS — R1310 Dysphagia, unspecified: Secondary | ICD-10-CM

## 2013-03-14 DIAGNOSIS — M62838 Other muscle spasm: Secondary | ICD-10-CM

## 2013-03-14 DIAGNOSIS — R259 Unspecified abnormal involuntary movements: Secondary | ICD-10-CM

## 2013-03-14 NOTE — Progress Notes (Signed)
Subjective:    Patient ID: HELIOS KOHLMANN is a 57 y.o. male.  HPI  Interim history:   Mr. Hinnenkamp is a 57 yo RH gentleman, who presents for FU consultation of abnormal involuntary facial movements, including twitching and abnormal eye closure intermittently. The patient is unaccompanied today and was last seen by me on 12/12/12, at which time I ordered a MBBS, which he had on 03/10/13 and I reviewed the report again today. We had called him back with the results and recommendations, which essentially were unremarkable without changes recommended to his food and liquid consistencies, but he did do better with his pills offered in pureed food, rather thatn with thin liquids. I had seen him on 09/14/12, at which time I talked to him about his recent sleep study and I suggested a trial of Buspar to reduce the associated anxiety.  I first met him on 08/29/12, and I had suggested a NPSG, as he reported waking up multiple times in the middle of the night secondary to the abnormal facial spasms. He was not sure if the movements subside completely when he is deeply asleep. He had a sleep study on 09/03/12 which I discussed with him in February 2014: His total AHI was 5.5 per hour c/w borderline obstructive sleep apnea, his REM related AHI was 22 per hour rendering moderate REM related OSA. He did not have any abnormal involuntary movements in sleep. I did not see that his muscle twitching woke him up. He did however have abnormal facial twitching that was evident especially on to EMG during periods of wakefulness. He has seen multiple neurologists in the past at [facility] and [facility] and his Sx have been ongoing for over a year.   He has been on Cymbalta at 60 mg since 09/03/12 and has noticed no improvement with the movements, but his mood improved. In the past, he has been on trials of amitriptyline, nortriptyline, Artane, valium, Klonopin without success. He has been reluctant to pursue Botox injections. I had checked  TSH, HBA1c, ANA, RPR, all unremarkable and I told him about his labs as well in the recent past.  Buspar was somewhat helpful in the beginning and I noted, that he was more depressed appearing and tearful in May 2014, and suggested an increase in his Buspar and reminded him to talk to his PCP about residual depression and reminded him to keep his eye appointment.   His Past Medical History Is Significant For: Past Medical History  Diagnosis Date  . Hypertension   . Tension headache   . Obesity   . Fatty liver     His Past Surgical History Is Significant For: Past Surgical History  Procedure Laterality Date  . Back surgery    . Cholecystectomy    . Lumosacral spine    . Cervical spine surgery      His Family History Is Significant For: No family history on file.  His Social History Is Significant For: History   Social History  . Marital Status: Divorced    Spouse Name: N/A    Number of Children: N/A  . Years of Education: N/A   Social History Main Topics  . Smoking status: Never Smoker   . Smokeless tobacco: Never Used  . Alcohol Use: Yes  . Drug Use: No  . Sexually Active: No   Other Topics Concern  . None   Social History Narrative  . None    His Allergies Are:  No Known Allergies:  His Current Medications Are:  Outpatient Encounter Prescriptions as of 03/14/2013  Medication Sig Dispense Refill  . amLODipine (NORVASC) 5 MG tablet Take 10 mg by mouth daily.       . Ascorbic Acid (VITAMIN C) 1000 MG tablet Take 1,000 mg by mouth daily.        Marland Kitchen aspirin 81 MG tablet Take 81 mg by mouth daily.        . busPIRone (BUSPAR) 15 MG tablet Take 1 tablet (15 mg total) by mouth 3 (three) times daily.  90 tablet  3  . co-enzyme Q-10 30 MG capsule Take 100 mg by mouth 3 (three) times daily.        . DULoxetine (CYMBALTA) 60 MG capsule Take 60 mg by mouth 2 (two) times daily.       . irbesartan (AVAPRO) 300 MG tablet Take 300 mg by mouth at bedtime.      Marland Kitchen LORazepam  (ATIVAN) 1 MG tablet Take 1 mg by mouth 2 (two) times daily.       . meloxicam (MOBIC) 15 MG tablet Take 15 mg by mouth daily.      . vitamin E (VITAMIN E) 400 UNIT capsule Take 400 Units by mouth daily.        . cholecalciferol (VITAMIN D) 1000 UNITS tablet Take 1,000 Units by mouth daily.        . irbesartan-hydrochlorothiazide (AVALIDE) 300-12.5 MG per tablet Take 1 tablet by mouth daily.       No facility-administered encounter medications on file as of 03/14/2013.  : Review of Systems  Constitutional: Positive for fatigue and unexpected weight change.  HENT: Positive for trouble swallowing and tinnitus.   Eyes: Positive for visual disturbance.  Endocrine: Positive for heat intolerance.  Musculoskeletal: Positive for arthralgias.  Neurological: Positive for speech difficulty.  Psychiatric/Behavioral: Positive for confusion, sleep disturbance and dysphoric mood.    Objective:  Neurologic Exam  Physical Exam Physical Examination:   Filed Vitals:   03/14/13 1526  BP: 128/87  Pulse: 98  Temp: 97.4 F (36.3 C)    General Examination: The patient is a very pleasant 57 y.o. male in no acute distress. He appears well-developed and well-nourished and well groomed.   HEENT: exam reveals intermittent facial twitching and abnormal mouth movements at times with lip smacking noted. He has intermittent periods of eye closure of one side or the other or both and intermittent tightening of his platysma. He also has excessive blinking without sustained eye closure or apraxia of eyelid opening. Hearing is intact. Neck is supple with full range of motion. Speech is clear today.   Chest: Clear to auscultation without wheezing, rhonchi or crackles noted.  Heart: S1+S2+0, regular and normal without murmurs, rubs or gallops noted.   Abdomen: Soft, non-tender and non-distended with normal bowel sounds appreciated on auscultation.  Extremities: There is no pitting edema in the distal lower  extremities bilaterally. Pedal pulses are intact.  Skin: Warm and dry without trophic changes noted. There are no varicose veins.  Musculoskeletal: exam reveals no obvious joint deformities, tenderness or joint swelling or erythema.   Neurologically:  Mental status: The patient is awake, alert and oriented in all 4 spheres. His memory, attention, language and knowledge are appropriate. There is no aphasia, agnosia, apraxia or anomia. Speech is clear with normal prosody and enunciation. Thought process is linear. Mood is depressed and a little tearful and affect is labile.  Cranial nerves are as described above under HEENT exam. In  addition, shoulder shrug is normal with equal shoulder height noted. Motor exam: Normal bulk, strength and tone is noted. There is no drift, tremor or rebound. Romberg is negative. Reflexes are 2+ throughout. Fine motor skills are intact with normal finger taps, normal hand movements, normal rapid alternating patting, normal foot taps and normal foot agility.  Cerebellar testing shows no dysmetria or intention tremor on finger to nose testing. Heel to shin is unremarkable bilaterally. There is no truncal or gait ataxia.  Sensory exam is intact to light touch, pinprick, vibration, temperature sense and proprioception in the upper and lower extremities.  Gait, station and balance are unremarkable. No veering to one side is noted. No leaning to one side is noted. Posture is age-appropriate and stance is narrow based. No problems turning are noted. He turns en bloc. Tandem walk is unremarkable. Intact toe and heel stance is noted. He has no other abnormal involuntary movements such as dyskinesias, athetosis, myoclonus. As the conversation progresses, his eyes open up more consistently.              Assessment and Plan:   In summary, Mr. Casanas is a 57 yo RH gentleman with an over 18 month Hx of abnormal involuntary movements affecting his face and eyes. His sleep study did not  show any abnormal movements in sleep and I had discussed it before. We did blood work and imaging studies, all non-revealing and I am not sure how else to help him at this point. I suggested a referral to [facility] neurology and he was in agreement. His exam is essentially unchanged today. He has previously had evaluations at [facility] and [facility] and was told in the past to see a psychiatrist which he had and he was told by the psychiatrist that this was "not in his head". I suggested that he continue with BuSpar 15 mg 3 times a day. We reviewed the recent swallow study report as well. I will see him back on an as needed basis.

## 2013-03-14 NOTE — Patient Instructions (Signed)
  As far as your medications are concerned, I would like to suggest continue the BuSpar.   As far as diagnostic testing: I will refer you to [facility].   I can see you back on an as needed basis.

## 2013-09-11 ENCOUNTER — Ambulatory Visit (HOSPITAL_COMMUNITY)
Admission: RE | Admit: 2013-09-11 | Discharge: 2013-09-11 | Disposition: A | Discharge status: Home / Self Care | Payer: BC Managed Care – PPO | Source: Ambulatory Visit | Attending: Internal Medicine | Admitting: Internal Medicine

## 2013-09-11 ENCOUNTER — Other Ambulatory Visit (HOSPITAL_COMMUNITY): Payer: Self-pay | Admitting: Internal Medicine

## 2013-09-11 DIAGNOSIS — I10 Essential (primary) hypertension: Secondary | ICD-10-CM | POA: Insufficient documentation

## 2013-09-11 DIAGNOSIS — R0989 Other specified symptoms and signs involving the circulatory and respiratory systems: Secondary | ICD-10-CM | POA: Insufficient documentation

## 2013-09-11 DIAGNOSIS — R05 Cough: Secondary | ICD-10-CM | POA: Insufficient documentation

## 2013-09-11 DIAGNOSIS — J40 Bronchitis, not specified as acute or chronic: Secondary | ICD-10-CM

## 2013-09-11 DIAGNOSIS — R059 Cough, unspecified: Secondary | ICD-10-CM

## 2013-09-11 DIAGNOSIS — Z8709 Personal history of other diseases of the respiratory system: Secondary | ICD-10-CM | POA: Insufficient documentation

## 2015-01-18 ENCOUNTER — Other Ambulatory Visit: Payer: Self-pay | Admitting: Occupational Medicine

## 2015-01-18 ENCOUNTER — Ambulatory Visit: Payer: Self-pay

## 2015-01-18 DIAGNOSIS — M25532 Pain in left wrist: Secondary | ICD-10-CM

## 2015-01-18 DIAGNOSIS — R0781 Pleurodynia: Secondary | ICD-10-CM

## 2016-08-11 ENCOUNTER — Other Ambulatory Visit (HOSPITAL_COMMUNITY): Payer: Self-pay | Admitting: Adult Health Nurse Practitioner

## 2016-08-11 ENCOUNTER — Ambulatory Visit (HOSPITAL_COMMUNITY)
Admission: RE | Admit: 2016-08-11 | Discharge: 2016-08-11 | Disposition: A | Discharge status: Home / Self Care | Payer: 59 | Source: Ambulatory Visit | Attending: Adult Health Nurse Practitioner | Admitting: Adult Health Nurse Practitioner

## 2016-08-11 DIAGNOSIS — R05 Cough: Secondary | ICD-10-CM

## 2016-08-11 DIAGNOSIS — J060 Acute laryngopharyngitis: Secondary | ICD-10-CM | POA: Diagnosis not present

## 2016-08-11 DIAGNOSIS — R059 Cough, unspecified: Secondary | ICD-10-CM

## 2016-08-11 DIAGNOSIS — J069 Acute upper respiratory infection, unspecified: Secondary | ICD-10-CM | POA: Diagnosis not present

## 2016-09-04 DIAGNOSIS — E782 Mixed hyperlipidemia: Secondary | ICD-10-CM | POA: Diagnosis not present

## 2016-09-04 DIAGNOSIS — I10 Essential (primary) hypertension: Secondary | ICD-10-CM | POA: Diagnosis not present

## 2016-09-04 DIAGNOSIS — R7301 Impaired fasting glucose: Secondary | ICD-10-CM | POA: Diagnosis not present

## 2016-09-08 DIAGNOSIS — R7301 Impaired fasting glucose: Secondary | ICD-10-CM | POA: Diagnosis not present

## 2016-09-08 DIAGNOSIS — Z0000 Encounter for general adult medical examination without abnormal findings: Secondary | ICD-10-CM | POA: Diagnosis not present

## 2016-09-08 DIAGNOSIS — I10 Essential (primary) hypertension: Secondary | ICD-10-CM | POA: Diagnosis not present

## 2016-09-21 DIAGNOSIS — M79671 Pain in right foot: Secondary | ICD-10-CM | POA: Diagnosis not present

## 2016-09-21 DIAGNOSIS — G5790 Unspecified mononeuropathy of unspecified lower limb: Secondary | ICD-10-CM | POA: Diagnosis not present

## 2016-11-19 DIAGNOSIS — J039 Acute tonsillitis, unspecified: Secondary | ICD-10-CM | POA: Diagnosis not present

## 2016-11-19 DIAGNOSIS — J029 Acute pharyngitis, unspecified: Secondary | ICD-10-CM | POA: Diagnosis not present

## 2017-11-09 DIAGNOSIS — M545 Low back pain: Secondary | ICD-10-CM | POA: Diagnosis not present

## 2017-11-18 DIAGNOSIS — E782 Mixed hyperlipidemia: Secondary | ICD-10-CM | POA: Diagnosis not present

## 2017-11-18 DIAGNOSIS — I10 Essential (primary) hypertension: Secondary | ICD-10-CM | POA: Diagnosis not present

## 2017-11-18 DIAGNOSIS — R7301 Impaired fasting glucose: Secondary | ICD-10-CM | POA: Diagnosis not present

## 2017-11-18 DIAGNOSIS — K769 Liver disease, unspecified: Secondary | ICD-10-CM | POA: Diagnosis not present

## 2017-11-24 DIAGNOSIS — R7301 Impaired fasting glucose: Secondary | ICD-10-CM | POA: Diagnosis not present

## 2017-11-24 DIAGNOSIS — I10 Essential (primary) hypertension: Secondary | ICD-10-CM | POA: Diagnosis not present

## 2017-11-24 DIAGNOSIS — Z0000 Encounter for general adult medical examination without abnormal findings: Secondary | ICD-10-CM | POA: Diagnosis not present

## 2018-01-06 DIAGNOSIS — L821 Other seborrheic keratosis: Secondary | ICD-10-CM | POA: Diagnosis not present

## 2018-01-06 DIAGNOSIS — D485 Neoplasm of uncertain behavior of skin: Secondary | ICD-10-CM | POA: Diagnosis not present

## 2018-01-06 DIAGNOSIS — L570 Actinic keratosis: Secondary | ICD-10-CM | POA: Diagnosis not present

## 2018-01-06 DIAGNOSIS — L0390 Cellulitis, unspecified: Secondary | ICD-10-CM | POA: Diagnosis not present

## 2018-01-06 DIAGNOSIS — L820 Inflamed seborrheic keratosis: Secondary | ICD-10-CM | POA: Diagnosis not present

## 2018-01-10 DIAGNOSIS — M25579 Pain in unspecified ankle and joints of unspecified foot: Secondary | ICD-10-CM | POA: Diagnosis not present

## 2018-01-10 DIAGNOSIS — M79672 Pain in left foot: Secondary | ICD-10-CM | POA: Diagnosis not present

## 2018-01-10 DIAGNOSIS — M79671 Pain in right foot: Secondary | ICD-10-CM | POA: Diagnosis not present

## 2018-02-14 DIAGNOSIS — H35033 Hypertensive retinopathy, bilateral: Secondary | ICD-10-CM | POA: Diagnosis not present

## 2018-02-28 DIAGNOSIS — M79672 Pain in left foot: Secondary | ICD-10-CM | POA: Diagnosis not present

## 2018-02-28 DIAGNOSIS — M25579 Pain in unspecified ankle and joints of unspecified foot: Secondary | ICD-10-CM | POA: Diagnosis not present

## 2018-02-28 DIAGNOSIS — M79671 Pain in right foot: Secondary | ICD-10-CM | POA: Diagnosis not present

## 2018-03-28 DIAGNOSIS — M25579 Pain in unspecified ankle and joints of unspecified foot: Secondary | ICD-10-CM | POA: Diagnosis not present

## 2018-03-28 DIAGNOSIS — G5790 Unspecified mononeuropathy of unspecified lower limb: Secondary | ICD-10-CM | POA: Diagnosis not present

## 2018-03-28 DIAGNOSIS — M79671 Pain in right foot: Secondary | ICD-10-CM | POA: Diagnosis not present

## 2018-04-28 IMAGING — CR DG CHEST 2V
3 series · 3 of 3 positions shown · non-contrast
Comparison: 01/18/2015

CLINICAL DATA: URI, worsening

EXAM:
CHEST  2 VIEW

[w chest pa]
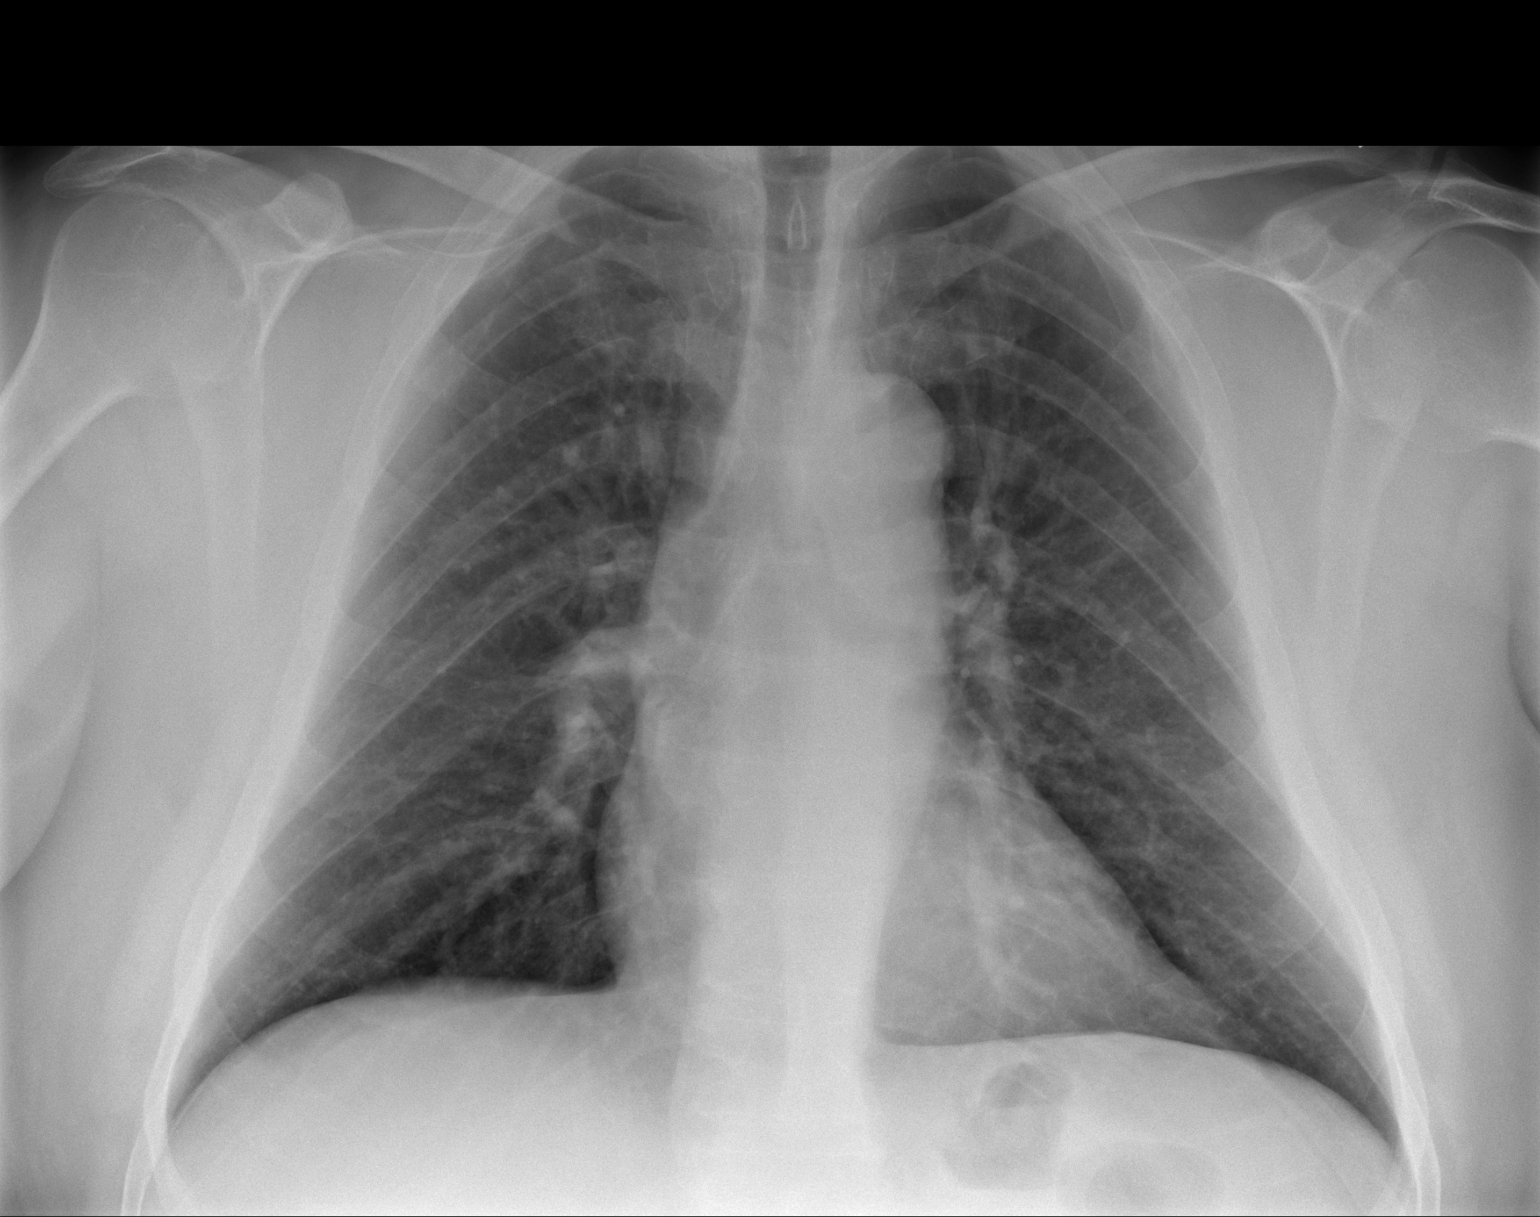

[w chest lat]
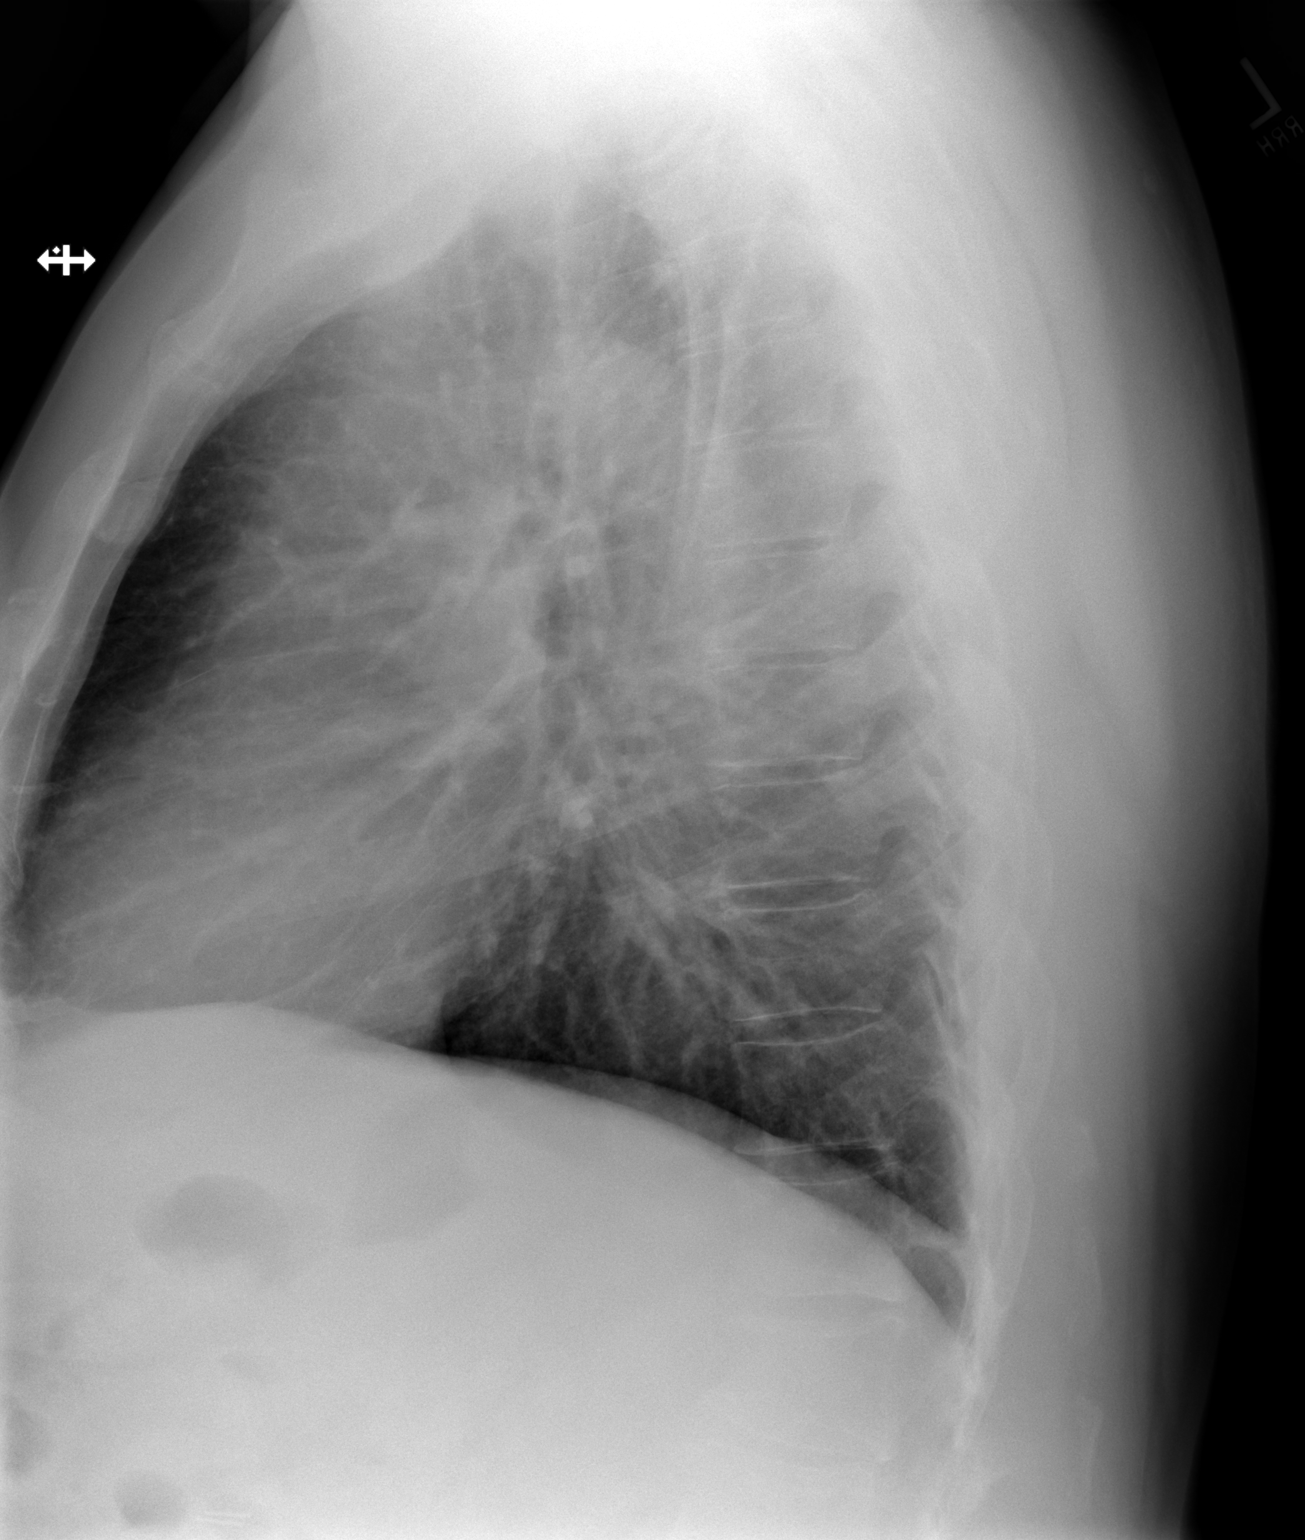

[w chest decub]
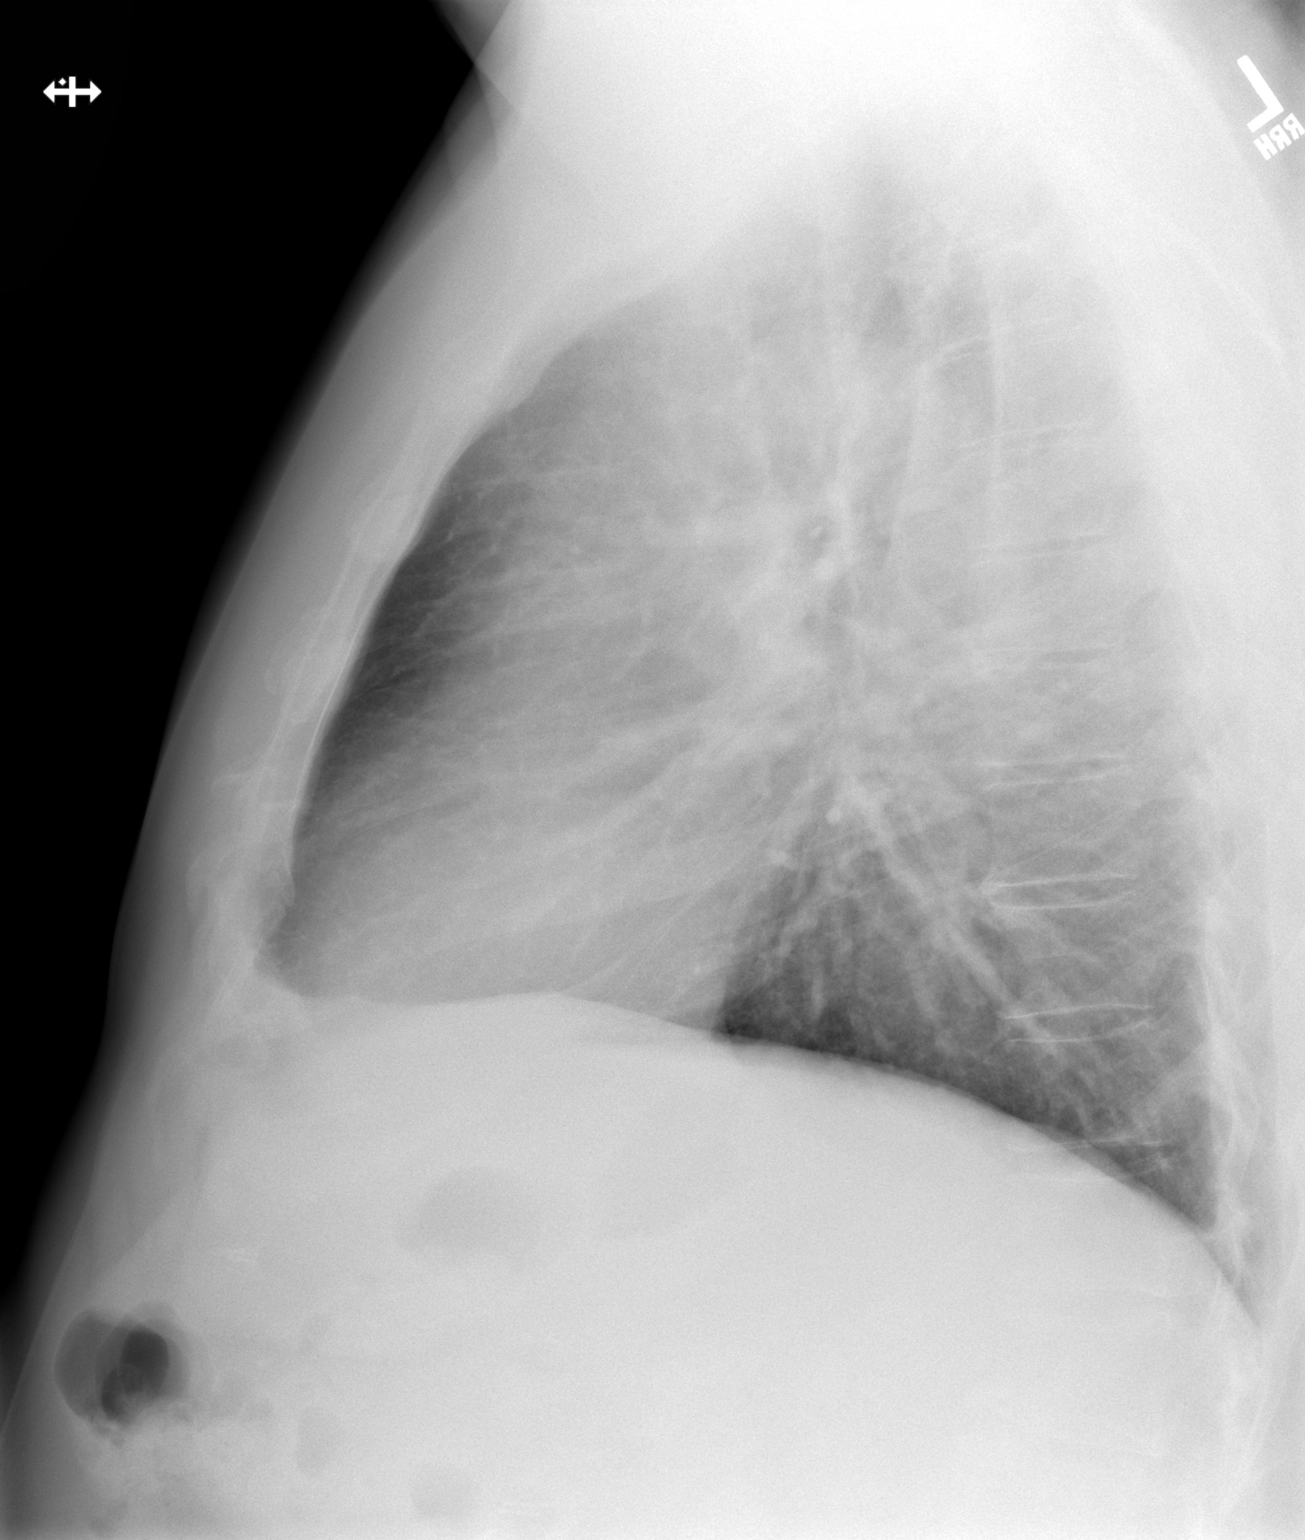

[3 of 3 positions shown; findings below may reference images not displayed]

FINDINGS: The heart size and mediastinal contours are within normal limits.
Both lungs are clear. The visualized skeletal structures are
unremarkable.
IMPRESSION: No active cardiopulmonary disease.

## 2018-05-02 DIAGNOSIS — G5790 Unspecified mononeuropathy of unspecified lower limb: Secondary | ICD-10-CM | POA: Diagnosis not present

## 2018-05-02 DIAGNOSIS — M25579 Pain in unspecified ankle and joints of unspecified foot: Secondary | ICD-10-CM | POA: Diagnosis not present

## 2018-05-02 DIAGNOSIS — M79671 Pain in right foot: Secondary | ICD-10-CM | POA: Diagnosis not present

## 2018-05-23 DIAGNOSIS — H401131 Primary open-angle glaucoma, bilateral, mild stage: Secondary | ICD-10-CM | POA: Diagnosis not present

## 2018-05-23 DIAGNOSIS — H25812 Combined forms of age-related cataract, left eye: Secondary | ICD-10-CM | POA: Diagnosis not present

## 2018-05-23 DIAGNOSIS — H25811 Combined forms of age-related cataract, right eye: Secondary | ICD-10-CM | POA: Diagnosis not present

## 2018-05-30 DIAGNOSIS — M79671 Pain in right foot: Secondary | ICD-10-CM | POA: Diagnosis not present

## 2018-05-30 DIAGNOSIS — M25579 Pain in unspecified ankle and joints of unspecified foot: Secondary | ICD-10-CM | POA: Diagnosis not present

## 2018-05-30 DIAGNOSIS — G5790 Unspecified mononeuropathy of unspecified lower limb: Secondary | ICD-10-CM | POA: Diagnosis not present

## 2018-06-15 DIAGNOSIS — Z01818 Encounter for other preprocedural examination: Secondary | ICD-10-CM | POA: Diagnosis not present

## 2018-06-15 DIAGNOSIS — H401131 Primary open-angle glaucoma, bilateral, mild stage: Secondary | ICD-10-CM | POA: Diagnosis not present

## 2018-06-15 DIAGNOSIS — H25811 Combined forms of age-related cataract, right eye: Secondary | ICD-10-CM | POA: Diagnosis not present

## 2018-06-16 DIAGNOSIS — H401131 Primary open-angle glaucoma, bilateral, mild stage: Secondary | ICD-10-CM | POA: Diagnosis not present

## 2018-06-16 DIAGNOSIS — H2511 Age-related nuclear cataract, right eye: Secondary | ICD-10-CM | POA: Diagnosis not present

## 2018-06-16 DIAGNOSIS — H25811 Combined forms of age-related cataract, right eye: Secondary | ICD-10-CM | POA: Diagnosis not present

## 2018-06-16 DIAGNOSIS — H401111 Primary open-angle glaucoma, right eye, mild stage: Secondary | ICD-10-CM | POA: Diagnosis not present

## 2018-06-27 DIAGNOSIS — G5790 Unspecified mononeuropathy of unspecified lower limb: Secondary | ICD-10-CM | POA: Diagnosis not present

## 2018-06-27 DIAGNOSIS — M25579 Pain in unspecified ankle and joints of unspecified foot: Secondary | ICD-10-CM | POA: Diagnosis not present

## 2018-06-27 DIAGNOSIS — M79671 Pain in right foot: Secondary | ICD-10-CM | POA: Diagnosis not present

## 2018-07-14 DIAGNOSIS — H401121 Primary open-angle glaucoma, left eye, mild stage: Secondary | ICD-10-CM | POA: Diagnosis not present

## 2018-07-14 DIAGNOSIS — H25812 Combined forms of age-related cataract, left eye: Secondary | ICD-10-CM | POA: Diagnosis not present

## 2018-07-14 DIAGNOSIS — H401131 Primary open-angle glaucoma, bilateral, mild stage: Secondary | ICD-10-CM | POA: Diagnosis not present

## 2018-07-25 DIAGNOSIS — M79671 Pain in right foot: Secondary | ICD-10-CM | POA: Diagnosis not present

## 2018-07-25 DIAGNOSIS — M25579 Pain in unspecified ankle and joints of unspecified foot: Secondary | ICD-10-CM | POA: Diagnosis not present

## 2018-07-25 DIAGNOSIS — G5790 Unspecified mononeuropathy of unspecified lower limb: Secondary | ICD-10-CM | POA: Diagnosis not present

## 2018-07-26 DIAGNOSIS — H2512 Age-related nuclear cataract, left eye: Secondary | ICD-10-CM | POA: Diagnosis not present

## 2018-08-31 DIAGNOSIS — T8522XA Displacement of intraocular lens, initial encounter: Secondary | ICD-10-CM | POA: Diagnosis not present

## 2018-08-31 DIAGNOSIS — H31402 Unspecified choroidal detachment, left eye: Secondary | ICD-10-CM | POA: Diagnosis not present

## 2018-09-06 DIAGNOSIS — M79671 Pain in right foot: Secondary | ICD-10-CM | POA: Diagnosis not present

## 2018-09-06 DIAGNOSIS — M25579 Pain in unspecified ankle and joints of unspecified foot: Secondary | ICD-10-CM | POA: Diagnosis not present

## 2018-09-06 DIAGNOSIS — G5790 Unspecified mononeuropathy of unspecified lower limb: Secondary | ICD-10-CM | POA: Diagnosis not present

## 2018-10-18 DIAGNOSIS — M79671 Pain in right foot: Secondary | ICD-10-CM | POA: Diagnosis not present

## 2018-10-18 DIAGNOSIS — M25579 Pain in unspecified ankle and joints of unspecified foot: Secondary | ICD-10-CM | POA: Diagnosis not present

## 2018-10-18 DIAGNOSIS — G5790 Unspecified mononeuropathy of unspecified lower limb: Secondary | ICD-10-CM | POA: Diagnosis not present

## 2018-10-26 DIAGNOSIS — H43813 Vitreous degeneration, bilateral: Secondary | ICD-10-CM | POA: Diagnosis not present

## 2018-10-26 DIAGNOSIS — H31402 Unspecified choroidal detachment, left eye: Secondary | ICD-10-CM | POA: Diagnosis not present

## 2018-11-21 DIAGNOSIS — G5790 Unspecified mononeuropathy of unspecified lower limb: Secondary | ICD-10-CM | POA: Diagnosis not present

## 2018-11-21 DIAGNOSIS — M79671 Pain in right foot: Secondary | ICD-10-CM | POA: Diagnosis not present

## 2018-11-21 DIAGNOSIS — M25579 Pain in unspecified ankle and joints of unspecified foot: Secondary | ICD-10-CM | POA: Diagnosis not present

## 2018-12-26 DIAGNOSIS — H401131 Primary open-angle glaucoma, bilateral, mild stage: Secondary | ICD-10-CM | POA: Diagnosis not present

## 2018-12-26 DIAGNOSIS — H43392 Other vitreous opacities, left eye: Secondary | ICD-10-CM | POA: Diagnosis not present

## 2019-01-04 DIAGNOSIS — M25579 Pain in unspecified ankle and joints of unspecified foot: Secondary | ICD-10-CM | POA: Diagnosis not present

## 2019-01-04 DIAGNOSIS — G5790 Unspecified mononeuropathy of unspecified lower limb: Secondary | ICD-10-CM | POA: Diagnosis not present

## 2019-01-04 DIAGNOSIS — M79671 Pain in right foot: Secondary | ICD-10-CM | POA: Diagnosis not present

## 2020-04-03 DIAGNOSIS — R69 Illness, unspecified: Secondary | ICD-10-CM | POA: Diagnosis not present

## 2020-04-30 DIAGNOSIS — D1801 Hemangioma of skin and subcutaneous tissue: Secondary | ICD-10-CM | POA: Diagnosis not present

## 2020-04-30 DIAGNOSIS — D225 Melanocytic nevi of trunk: Secondary | ICD-10-CM | POA: Diagnosis not present

## 2020-04-30 DIAGNOSIS — D485 Neoplasm of uncertain behavior of skin: Secondary | ICD-10-CM | POA: Diagnosis not present

## 2020-04-30 DIAGNOSIS — L820 Inflamed seborrheic keratosis: Secondary | ICD-10-CM | POA: Diagnosis not present

## 2020-04-30 DIAGNOSIS — L570 Actinic keratosis: Secondary | ICD-10-CM | POA: Diagnosis not present

## 2020-04-30 DIAGNOSIS — D239 Other benign neoplasm of skin, unspecified: Secondary | ICD-10-CM | POA: Diagnosis not present

## 2020-11-25 DIAGNOSIS — K529 Noninfective gastroenteritis and colitis, unspecified: Secondary | ICD-10-CM | POA: Diagnosis not present

## 2020-11-25 DIAGNOSIS — Z6841 Body Mass Index (BMI) 40.0 and over, adult: Secondary | ICD-10-CM | POA: Diagnosis not present

## 2020-11-25 DIAGNOSIS — I10 Essential (primary) hypertension: Secondary | ICD-10-CM | POA: Diagnosis not present

## 2020-11-26 DIAGNOSIS — A062 Amebic nondysenteric colitis: Secondary | ICD-10-CM | POA: Diagnosis not present

## 2020-11-26 DIAGNOSIS — K529 Noninfective gastroenteritis and colitis, unspecified: Secondary | ICD-10-CM | POA: Diagnosis not present

## 2020-11-26 DIAGNOSIS — Z125 Encounter for screening for malignant neoplasm of prostate: Secondary | ICD-10-CM | POA: Diagnosis not present

## 2020-11-26 DIAGNOSIS — I10 Essential (primary) hypertension: Secondary | ICD-10-CM | POA: Diagnosis not present

## 2021-02-25 ENCOUNTER — Other Ambulatory Visit (HOSPITAL_COMMUNITY): Payer: Self-pay | Admitting: Internal Medicine

## 2021-02-25 ENCOUNTER — Other Ambulatory Visit: Payer: Self-pay

## 2021-02-25 ENCOUNTER — Ambulatory Visit (HOSPITAL_COMMUNITY)
Admission: RE | Admit: 2021-02-25 | Discharge: 2021-02-25 | Disposition: A | Discharge status: Home / Self Care | Payer: Medicare HMO | Source: Ambulatory Visit | Attending: Internal Medicine | Admitting: Internal Medicine

## 2021-02-25 DIAGNOSIS — M79661 Pain in right lower leg: Secondary | ICD-10-CM | POA: Insufficient documentation

## 2021-02-25 DIAGNOSIS — S8011XA Contusion of right lower leg, initial encounter: Secondary | ICD-10-CM | POA: Diagnosis not present

## 2021-02-25 DIAGNOSIS — M7989 Other specified soft tissue disorders: Secondary | ICD-10-CM | POA: Insufficient documentation

## 2021-02-25 DIAGNOSIS — Z6841 Body Mass Index (BMI) 40.0 and over, adult: Secondary | ICD-10-CM | POA: Diagnosis not present

## 2021-02-25 DIAGNOSIS — R600 Localized edema: Secondary | ICD-10-CM | POA: Diagnosis not present

## 2021-02-25 DIAGNOSIS — M79604 Pain in right leg: Secondary | ICD-10-CM | POA: Diagnosis not present

## 2021-07-01 DIAGNOSIS — L918 Other hypertrophic disorders of the skin: Secondary | ICD-10-CM | POA: Diagnosis not present

## 2021-07-01 DIAGNOSIS — D485 Neoplasm of uncertain behavior of skin: Secondary | ICD-10-CM | POA: Diagnosis not present

## 2021-07-01 DIAGNOSIS — L570 Actinic keratosis: Secondary | ICD-10-CM | POA: Diagnosis not present

## 2021-08-19 DIAGNOSIS — I739 Peripheral vascular disease, unspecified: Secondary | ICD-10-CM | POA: Diagnosis not present

## 2021-08-19 DIAGNOSIS — M79674 Pain in right toe(s): Secondary | ICD-10-CM | POA: Diagnosis not present

## 2021-08-19 DIAGNOSIS — M79672 Pain in left foot: Secondary | ICD-10-CM | POA: Diagnosis not present

## 2021-08-19 DIAGNOSIS — M79671 Pain in right foot: Secondary | ICD-10-CM | POA: Diagnosis not present

## 2021-08-19 DIAGNOSIS — M79675 Pain in left toe(s): Secondary | ICD-10-CM | POA: Diagnosis not present

## 2021-08-19 DIAGNOSIS — L110 Acquired keratosis follicularis: Secondary | ICD-10-CM | POA: Diagnosis not present

## 2021-08-25 DIAGNOSIS — M25579 Pain in unspecified ankle and joints of unspecified foot: Secondary | ICD-10-CM | POA: Diagnosis not present

## 2021-08-25 DIAGNOSIS — M79672 Pain in left foot: Secondary | ICD-10-CM | POA: Diagnosis not present

## 2021-08-25 DIAGNOSIS — G5790 Unspecified mononeuropathy of unspecified lower limb: Secondary | ICD-10-CM | POA: Diagnosis not present

## 2021-08-25 DIAGNOSIS — M79671 Pain in right foot: Secondary | ICD-10-CM | POA: Diagnosis not present

## 2021-09-22 DIAGNOSIS — G5790 Unspecified mononeuropathy of unspecified lower limb: Secondary | ICD-10-CM | POA: Diagnosis not present

## 2021-09-22 DIAGNOSIS — M79671 Pain in right foot: Secondary | ICD-10-CM | POA: Diagnosis not present

## 2021-09-22 DIAGNOSIS — M792 Neuralgia and neuritis, unspecified: Secondary | ICD-10-CM | POA: Diagnosis not present

## 2021-09-22 DIAGNOSIS — M79672 Pain in left foot: Secondary | ICD-10-CM | POA: Diagnosis not present

## 2021-10-13 DIAGNOSIS — Z125 Encounter for screening for malignant neoplasm of prostate: Secondary | ICD-10-CM | POA: Diagnosis not present

## 2021-10-13 DIAGNOSIS — I10 Essential (primary) hypertension: Secondary | ICD-10-CM | POA: Diagnosis not present

## 2021-10-13 DIAGNOSIS — Z0000 Encounter for general adult medical examination without abnormal findings: Secondary | ICD-10-CM | POA: Diagnosis not present

## 2021-10-13 DIAGNOSIS — E559 Vitamin D deficiency, unspecified: Secondary | ICD-10-CM | POA: Diagnosis not present

## 2021-10-13 DIAGNOSIS — Z6841 Body Mass Index (BMI) 40.0 and over, adult: Secondary | ICD-10-CM | POA: Diagnosis not present

## 2021-10-13 DIAGNOSIS — S8011XA Contusion of right lower leg, initial encounter: Secondary | ICD-10-CM | POA: Diagnosis not present

## 2021-10-13 DIAGNOSIS — R7303 Prediabetes: Secondary | ICD-10-CM | POA: Diagnosis not present

## 2021-10-23 DIAGNOSIS — M792 Neuralgia and neuritis, unspecified: Secondary | ICD-10-CM | POA: Diagnosis not present

## 2021-10-23 DIAGNOSIS — G5790 Unspecified mononeuropathy of unspecified lower limb: Secondary | ICD-10-CM | POA: Diagnosis not present

## 2021-10-23 DIAGNOSIS — M79671 Pain in right foot: Secondary | ICD-10-CM | POA: Diagnosis not present

## 2021-10-23 DIAGNOSIS — G600 Hereditary motor and sensory neuropathy: Secondary | ICD-10-CM | POA: Diagnosis not present

## 2021-10-23 DIAGNOSIS — M79672 Pain in left foot: Secondary | ICD-10-CM | POA: Diagnosis not present

## 2021-11-24 DIAGNOSIS — G5790 Unspecified mononeuropathy of unspecified lower limb: Secondary | ICD-10-CM | POA: Diagnosis not present

## 2021-11-24 DIAGNOSIS — G600 Hereditary motor and sensory neuropathy: Secondary | ICD-10-CM | POA: Diagnosis not present

## 2021-11-24 DIAGNOSIS — M792 Neuralgia and neuritis, unspecified: Secondary | ICD-10-CM | POA: Diagnosis not present

## 2021-11-24 DIAGNOSIS — M79671 Pain in right foot: Secondary | ICD-10-CM | POA: Diagnosis not present

## 2021-11-24 DIAGNOSIS — M79672 Pain in left foot: Secondary | ICD-10-CM | POA: Diagnosis not present

## 2021-12-22 DIAGNOSIS — G600 Hereditary motor and sensory neuropathy: Secondary | ICD-10-CM | POA: Diagnosis not present

## 2021-12-22 DIAGNOSIS — M79671 Pain in right foot: Secondary | ICD-10-CM | POA: Diagnosis not present

## 2021-12-22 DIAGNOSIS — M792 Neuralgia and neuritis, unspecified: Secondary | ICD-10-CM | POA: Diagnosis not present

## 2021-12-22 DIAGNOSIS — M79672 Pain in left foot: Secondary | ICD-10-CM | POA: Diagnosis not present

## 2021-12-22 DIAGNOSIS — G5790 Unspecified mononeuropathy of unspecified lower limb: Secondary | ICD-10-CM | POA: Diagnosis not present

## 2022-01-19 DIAGNOSIS — G600 Hereditary motor and sensory neuropathy: Secondary | ICD-10-CM | POA: Diagnosis not present

## 2022-01-19 DIAGNOSIS — M792 Neuralgia and neuritis, unspecified: Secondary | ICD-10-CM | POA: Diagnosis not present

## 2022-01-19 DIAGNOSIS — G5790 Unspecified mononeuropathy of unspecified lower limb: Secondary | ICD-10-CM | POA: Diagnosis not present

## 2022-01-19 DIAGNOSIS — M79672 Pain in left foot: Secondary | ICD-10-CM | POA: Diagnosis not present

## 2022-01-19 DIAGNOSIS — M79671 Pain in right foot: Secondary | ICD-10-CM | POA: Diagnosis not present

## 2022-02-23 DIAGNOSIS — G600 Hereditary motor and sensory neuropathy: Secondary | ICD-10-CM | POA: Diagnosis not present

## 2022-02-23 DIAGNOSIS — M79671 Pain in right foot: Secondary | ICD-10-CM | POA: Diagnosis not present

## 2022-02-23 DIAGNOSIS — M792 Neuralgia and neuritis, unspecified: Secondary | ICD-10-CM | POA: Diagnosis not present

## 2022-02-23 DIAGNOSIS — G5790 Unspecified mononeuropathy of unspecified lower limb: Secondary | ICD-10-CM | POA: Diagnosis not present

## 2022-02-23 DIAGNOSIS — M79672 Pain in left foot: Secondary | ICD-10-CM | POA: Diagnosis not present

## 2022-03-23 DIAGNOSIS — G600 Hereditary motor and sensory neuropathy: Secondary | ICD-10-CM | POA: Diagnosis not present

## 2022-03-23 DIAGNOSIS — G5790 Unspecified mononeuropathy of unspecified lower limb: Secondary | ICD-10-CM | POA: Diagnosis not present

## 2022-03-23 DIAGNOSIS — M79671 Pain in right foot: Secondary | ICD-10-CM | POA: Diagnosis not present

## 2022-03-23 DIAGNOSIS — M79672 Pain in left foot: Secondary | ICD-10-CM | POA: Diagnosis not present

## 2022-03-23 DIAGNOSIS — M792 Neuralgia and neuritis, unspecified: Secondary | ICD-10-CM | POA: Diagnosis not present

## 2022-04-20 DIAGNOSIS — Z6841 Body Mass Index (BMI) 40.0 and over, adult: Secondary | ICD-10-CM | POA: Diagnosis not present

## 2022-04-20 DIAGNOSIS — I10 Essential (primary) hypertension: Secondary | ICD-10-CM | POA: Diagnosis not present

## 2022-04-20 DIAGNOSIS — Z0000 Encounter for general adult medical examination without abnormal findings: Secondary | ICD-10-CM | POA: Diagnosis not present

## 2022-04-20 DIAGNOSIS — E669 Obesity, unspecified: Secondary | ICD-10-CM | POA: Diagnosis not present

## 2022-04-21 DIAGNOSIS — G5790 Unspecified mononeuropathy of unspecified lower limb: Secondary | ICD-10-CM | POA: Diagnosis not present

## 2022-04-21 DIAGNOSIS — M79672 Pain in left foot: Secondary | ICD-10-CM | POA: Diagnosis not present

## 2022-04-21 DIAGNOSIS — G600 Hereditary motor and sensory neuropathy: Secondary | ICD-10-CM | POA: Diagnosis not present

## 2022-04-21 DIAGNOSIS — M792 Neuralgia and neuritis, unspecified: Secondary | ICD-10-CM | POA: Diagnosis not present

## 2022-04-21 DIAGNOSIS — M79671 Pain in right foot: Secondary | ICD-10-CM | POA: Diagnosis not present

## 2022-05-19 DIAGNOSIS — G5790 Unspecified mononeuropathy of unspecified lower limb: Secondary | ICD-10-CM | POA: Diagnosis not present

## 2022-05-19 DIAGNOSIS — M79672 Pain in left foot: Secondary | ICD-10-CM | POA: Diagnosis not present

## 2022-05-19 DIAGNOSIS — M792 Neuralgia and neuritis, unspecified: Secondary | ICD-10-CM | POA: Diagnosis not present

## 2022-05-19 DIAGNOSIS — G600 Hereditary motor and sensory neuropathy: Secondary | ICD-10-CM | POA: Diagnosis not present

## 2022-05-19 DIAGNOSIS — M79671 Pain in right foot: Secondary | ICD-10-CM | POA: Diagnosis not present

## 2022-06-16 DIAGNOSIS — M79672 Pain in left foot: Secondary | ICD-10-CM | POA: Diagnosis not present

## 2022-06-16 DIAGNOSIS — M79671 Pain in right foot: Secondary | ICD-10-CM | POA: Diagnosis not present

## 2022-06-16 DIAGNOSIS — G600 Hereditary motor and sensory neuropathy: Secondary | ICD-10-CM | POA: Diagnosis not present

## 2022-06-16 DIAGNOSIS — M792 Neuralgia and neuritis, unspecified: Secondary | ICD-10-CM | POA: Diagnosis not present

## 2022-06-16 DIAGNOSIS — G5790 Unspecified mononeuropathy of unspecified lower limb: Secondary | ICD-10-CM | POA: Diagnosis not present

## 2022-06-25 DIAGNOSIS — M79675 Pain in left toe(s): Secondary | ICD-10-CM | POA: Diagnosis not present

## 2022-06-25 DIAGNOSIS — M79674 Pain in right toe(s): Secondary | ICD-10-CM | POA: Diagnosis not present

## 2022-06-25 DIAGNOSIS — G600 Hereditary motor and sensory neuropathy: Secondary | ICD-10-CM | POA: Diagnosis not present

## 2022-06-25 DIAGNOSIS — I739 Peripheral vascular disease, unspecified: Secondary | ICD-10-CM | POA: Diagnosis not present

## 2022-06-25 DIAGNOSIS — M79671 Pain in right foot: Secondary | ICD-10-CM | POA: Diagnosis not present

## 2022-06-25 DIAGNOSIS — M79672 Pain in left foot: Secondary | ICD-10-CM | POA: Diagnosis not present

## 2022-06-25 DIAGNOSIS — L110 Acquired keratosis follicularis: Secondary | ICD-10-CM | POA: Diagnosis not present

## 2022-07-06 DIAGNOSIS — D239 Other benign neoplasm of skin, unspecified: Secondary | ICD-10-CM | POA: Diagnosis not present

## 2022-07-06 DIAGNOSIS — Z1283 Encounter for screening for malignant neoplasm of skin: Secondary | ICD-10-CM | POA: Diagnosis not present

## 2022-07-06 DIAGNOSIS — L570 Actinic keratosis: Secondary | ICD-10-CM | POA: Diagnosis not present

## 2022-07-14 DIAGNOSIS — M79671 Pain in right foot: Secondary | ICD-10-CM | POA: Diagnosis not present

## 2022-07-14 DIAGNOSIS — M792 Neuralgia and neuritis, unspecified: Secondary | ICD-10-CM | POA: Diagnosis not present

## 2022-07-14 DIAGNOSIS — M79672 Pain in left foot: Secondary | ICD-10-CM | POA: Diagnosis not present

## 2022-07-14 DIAGNOSIS — G600 Hereditary motor and sensory neuropathy: Secondary | ICD-10-CM | POA: Diagnosis not present

## 2022-07-14 DIAGNOSIS — G5790 Unspecified mononeuropathy of unspecified lower limb: Secondary | ICD-10-CM | POA: Diagnosis not present

## 2022-08-13 DIAGNOSIS — M79672 Pain in left foot: Secondary | ICD-10-CM | POA: Diagnosis not present

## 2022-08-13 DIAGNOSIS — G5790 Unspecified mononeuropathy of unspecified lower limb: Secondary | ICD-10-CM | POA: Diagnosis not present

## 2022-08-13 DIAGNOSIS — G600 Hereditary motor and sensory neuropathy: Secondary | ICD-10-CM | POA: Diagnosis not present

## 2022-08-13 DIAGNOSIS — M792 Neuralgia and neuritis, unspecified: Secondary | ICD-10-CM | POA: Diagnosis not present

## 2022-08-13 DIAGNOSIS — M79671 Pain in right foot: Secondary | ICD-10-CM | POA: Diagnosis not present

## 2022-08-20 DIAGNOSIS — I10 Essential (primary) hypertension: Secondary | ICD-10-CM | POA: Diagnosis not present

## 2022-08-20 DIAGNOSIS — Z6838 Body mass index (BMI) 38.0-38.9, adult: Secondary | ICD-10-CM | POA: Diagnosis not present

## 2022-09-03 DIAGNOSIS — M79675 Pain in left toe(s): Secondary | ICD-10-CM | POA: Diagnosis not present

## 2022-09-03 DIAGNOSIS — M79671 Pain in right foot: Secondary | ICD-10-CM | POA: Diagnosis not present

## 2022-09-03 DIAGNOSIS — M79674 Pain in right toe(s): Secondary | ICD-10-CM | POA: Diagnosis not present

## 2022-09-03 DIAGNOSIS — I739 Peripheral vascular disease, unspecified: Secondary | ICD-10-CM | POA: Diagnosis not present

## 2022-09-03 DIAGNOSIS — G600 Hereditary motor and sensory neuropathy: Secondary | ICD-10-CM | POA: Diagnosis not present

## 2022-09-03 DIAGNOSIS — G5790 Unspecified mononeuropathy of unspecified lower limb: Secondary | ICD-10-CM | POA: Diagnosis not present

## 2022-09-03 DIAGNOSIS — M79672 Pain in left foot: Secondary | ICD-10-CM | POA: Diagnosis not present

## 2022-09-10 DIAGNOSIS — G600 Hereditary motor and sensory neuropathy: Secondary | ICD-10-CM | POA: Diagnosis not present

## 2022-09-10 DIAGNOSIS — G5790 Unspecified mononeuropathy of unspecified lower limb: Secondary | ICD-10-CM | POA: Diagnosis not present

## 2022-09-10 DIAGNOSIS — M792 Neuralgia and neuritis, unspecified: Secondary | ICD-10-CM | POA: Diagnosis not present

## 2022-09-10 DIAGNOSIS — M79671 Pain in right foot: Secondary | ICD-10-CM | POA: Diagnosis not present

## 2022-09-10 DIAGNOSIS — M79672 Pain in left foot: Secondary | ICD-10-CM | POA: Diagnosis not present

## 2022-10-08 DIAGNOSIS — G5790 Unspecified mononeuropathy of unspecified lower limb: Secondary | ICD-10-CM | POA: Diagnosis not present

## 2022-10-08 DIAGNOSIS — M79672 Pain in left foot: Secondary | ICD-10-CM | POA: Diagnosis not present

## 2022-10-08 DIAGNOSIS — G600 Hereditary motor and sensory neuropathy: Secondary | ICD-10-CM | POA: Diagnosis not present

## 2022-10-08 DIAGNOSIS — M792 Neuralgia and neuritis, unspecified: Secondary | ICD-10-CM | POA: Diagnosis not present

## 2022-10-08 DIAGNOSIS — M79671 Pain in right foot: Secondary | ICD-10-CM | POA: Diagnosis not present

## 2022-10-11 DIAGNOSIS — R531 Weakness: Secondary | ICD-10-CM | POA: Diagnosis not present

## 2022-10-11 DIAGNOSIS — R4781 Slurred speech: Secondary | ICD-10-CM | POA: Diagnosis not present

## 2022-10-11 DIAGNOSIS — Z743 Need for continuous supervision: Secondary | ICD-10-CM | POA: Diagnosis not present

## 2022-10-12 DIAGNOSIS — R29702 NIHSS score 2: Secondary | ICD-10-CM | POA: Diagnosis not present

## 2022-10-12 DIAGNOSIS — R69 Illness, unspecified: Secondary | ICD-10-CM | POA: Diagnosis not present

## 2022-10-12 DIAGNOSIS — H269 Unspecified cataract: Secondary | ICD-10-CM | POA: Diagnosis not present

## 2022-10-12 DIAGNOSIS — I63233 Cerebral infarction due to unspecified occlusion or stenosis of bilateral carotid arteries: Secondary | ICD-10-CM | POA: Diagnosis not present

## 2022-10-12 DIAGNOSIS — G8194 Hemiplegia, unspecified affecting left nondominant side: Secondary | ICD-10-CM | POA: Diagnosis not present

## 2022-10-12 DIAGNOSIS — Z7902 Long term (current) use of antithrombotics/antiplatelets: Secondary | ICD-10-CM | POA: Diagnosis not present

## 2022-10-12 DIAGNOSIS — R531 Weakness: Secondary | ICD-10-CM | POA: Diagnosis not present

## 2022-10-12 DIAGNOSIS — I639 Cerebral infarction, unspecified: Secondary | ICD-10-CM | POA: Diagnosis not present

## 2022-10-12 DIAGNOSIS — Z79899 Other long term (current) drug therapy: Secondary | ICD-10-CM | POA: Diagnosis not present

## 2022-10-12 DIAGNOSIS — I69354 Hemiplegia and hemiparesis following cerebral infarction affecting left non-dominant side: Secondary | ICD-10-CM | POA: Diagnosis not present

## 2022-10-12 DIAGNOSIS — R471 Dysarthria and anarthria: Secondary | ICD-10-CM | POA: Diagnosis not present

## 2022-10-12 DIAGNOSIS — Z7982 Long term (current) use of aspirin: Secondary | ICD-10-CM | POA: Diagnosis not present

## 2022-10-12 DIAGNOSIS — I10 Essential (primary) hypertension: Secondary | ICD-10-CM | POA: Diagnosis not present

## 2022-10-12 DIAGNOSIS — I6523 Occlusion and stenosis of bilateral carotid arteries: Secondary | ICD-10-CM | POA: Diagnosis not present

## 2022-10-12 DIAGNOSIS — I69322 Dysarthria following cerebral infarction: Secondary | ICD-10-CM | POA: Diagnosis not present

## 2022-10-12 DIAGNOSIS — R29898 Other symptoms and signs involving the musculoskeletal system: Secondary | ICD-10-CM | POA: Diagnosis not present

## 2022-10-12 DIAGNOSIS — R29704 NIHSS score 4: Secondary | ICD-10-CM | POA: Diagnosis not present

## 2022-10-16 DIAGNOSIS — R471 Dysarthria and anarthria: Secondary | ICD-10-CM | POA: Diagnosis not present

## 2022-10-16 DIAGNOSIS — Z7901 Long term (current) use of anticoagulants: Secondary | ICD-10-CM | POA: Diagnosis not present

## 2022-10-16 DIAGNOSIS — R41841 Cognitive communication deficit: Secondary | ICD-10-CM | POA: Diagnosis not present

## 2022-10-16 DIAGNOSIS — R2681 Unsteadiness on feet: Secondary | ICD-10-CM | POA: Diagnosis not present

## 2022-10-16 DIAGNOSIS — Z79899 Other long term (current) drug therapy: Secondary | ICD-10-CM | POA: Diagnosis not present

## 2022-10-16 DIAGNOSIS — I69354 Hemiplegia and hemiparesis following cerebral infarction affecting left non-dominant side: Secondary | ICD-10-CM | POA: Diagnosis not present

## 2022-10-16 DIAGNOSIS — Z7982 Long term (current) use of aspirin: Secondary | ICD-10-CM | POA: Diagnosis not present

## 2022-10-16 DIAGNOSIS — E669 Obesity, unspecified: Secondary | ICD-10-CM | POA: Diagnosis not present

## 2022-10-16 DIAGNOSIS — E785 Hyperlipidemia, unspecified: Secondary | ICD-10-CM | POA: Diagnosis not present

## 2022-10-16 DIAGNOSIS — I69322 Dysarthria following cerebral infarction: Secondary | ICD-10-CM | POA: Diagnosis not present

## 2022-10-16 DIAGNOSIS — Z8673 Personal history of transient ischemic attack (TIA), and cerebral infarction without residual deficits: Secondary | ICD-10-CM | POA: Diagnosis not present

## 2022-10-16 DIAGNOSIS — R531 Weakness: Secondary | ICD-10-CM | POA: Diagnosis not present

## 2022-10-16 DIAGNOSIS — M6281 Muscle weakness (generalized): Secondary | ICD-10-CM | POA: Diagnosis not present

## 2022-10-16 DIAGNOSIS — E7849 Other hyperlipidemia: Secondary | ICD-10-CM | POA: Diagnosis not present

## 2022-10-16 DIAGNOSIS — I10 Essential (primary) hypertension: Secondary | ICD-10-CM | POA: Diagnosis not present

## 2022-10-18 DIAGNOSIS — E7849 Other hyperlipidemia: Secondary | ICD-10-CM | POA: Diagnosis not present

## 2022-10-18 DIAGNOSIS — Z8673 Personal history of transient ischemic attack (TIA), and cerebral infarction without residual deficits: Secondary | ICD-10-CM | POA: Diagnosis not present

## 2022-10-18 DIAGNOSIS — I10 Essential (primary) hypertension: Secondary | ICD-10-CM | POA: Diagnosis not present

## 2022-10-18 DIAGNOSIS — R531 Weakness: Secondary | ICD-10-CM | POA: Diagnosis not present

## 2022-10-18 DIAGNOSIS — E669 Obesity, unspecified: Secondary | ICD-10-CM | POA: Diagnosis not present

## 2022-10-27 DIAGNOSIS — I69354 Hemiplegia and hemiparesis following cerebral infarction affecting left non-dominant side: Secondary | ICD-10-CM | POA: Diagnosis not present

## 2022-10-27 DIAGNOSIS — M6281 Muscle weakness (generalized): Secondary | ICD-10-CM | POA: Diagnosis not present

## 2022-10-29 DIAGNOSIS — Z6836 Body mass index (BMI) 36.0-36.9, adult: Secondary | ICD-10-CM | POA: Diagnosis not present

## 2022-10-29 DIAGNOSIS — Z8673 Personal history of transient ischemic attack (TIA), and cerebral infarction without residual deficits: Secondary | ICD-10-CM | POA: Diagnosis not present

## 2022-10-29 DIAGNOSIS — I10 Essential (primary) hypertension: Secondary | ICD-10-CM | POA: Diagnosis not present

## 2022-11-02 DIAGNOSIS — M6281 Muscle weakness (generalized): Secondary | ICD-10-CM | POA: Diagnosis not present

## 2022-11-02 DIAGNOSIS — I69354 Hemiplegia and hemiparesis following cerebral infarction affecting left non-dominant side: Secondary | ICD-10-CM | POA: Diagnosis not present

## 2022-11-05 DIAGNOSIS — I69354 Hemiplegia and hemiparesis following cerebral infarction affecting left non-dominant side: Secondary | ICD-10-CM | POA: Diagnosis not present

## 2022-11-05 DIAGNOSIS — M6281 Muscle weakness (generalized): Secondary | ICD-10-CM | POA: Diagnosis not present

## 2022-11-09 DIAGNOSIS — G600 Hereditary motor and sensory neuropathy: Secondary | ICD-10-CM | POA: Diagnosis not present

## 2022-11-09 DIAGNOSIS — G5790 Unspecified mononeuropathy of unspecified lower limb: Secondary | ICD-10-CM | POA: Diagnosis not present

## 2022-11-09 DIAGNOSIS — M79672 Pain in left foot: Secondary | ICD-10-CM | POA: Diagnosis not present

## 2022-11-09 DIAGNOSIS — M792 Neuralgia and neuritis, unspecified: Secondary | ICD-10-CM | POA: Diagnosis not present

## 2022-11-09 DIAGNOSIS — M79671 Pain in right foot: Secondary | ICD-10-CM | POA: Diagnosis not present

## 2022-11-10 DIAGNOSIS — M6281 Muscle weakness (generalized): Secondary | ICD-10-CM | POA: Diagnosis not present

## 2022-11-10 DIAGNOSIS — I69354 Hemiplegia and hemiparesis following cerebral infarction affecting left non-dominant side: Secondary | ICD-10-CM | POA: Diagnosis not present

## 2022-11-12 DIAGNOSIS — M79675 Pain in left toe(s): Secondary | ICD-10-CM | POA: Diagnosis not present

## 2022-11-12 DIAGNOSIS — M79672 Pain in left foot: Secondary | ICD-10-CM | POA: Diagnosis not present

## 2022-11-12 DIAGNOSIS — M79674 Pain in right toe(s): Secondary | ICD-10-CM | POA: Diagnosis not present

## 2022-11-12 DIAGNOSIS — I739 Peripheral vascular disease, unspecified: Secondary | ICD-10-CM | POA: Diagnosis not present

## 2022-11-12 DIAGNOSIS — G600 Hereditary motor and sensory neuropathy: Secondary | ICD-10-CM | POA: Diagnosis not present

## 2022-11-12 DIAGNOSIS — M2042 Other hammer toe(s) (acquired), left foot: Secondary | ICD-10-CM | POA: Diagnosis not present

## 2022-11-12 DIAGNOSIS — M79671 Pain in right foot: Secondary | ICD-10-CM | POA: Diagnosis not present

## 2022-11-12 IMAGING — US US EXTREM LOW VENOUS*R*
1 series · 13 of 24 positions shown · non-contrast
Comparison: None.

CLINICAL DATA: Right lower extremity pain and edema.



[Series 1: us extrem low venous*right* · 0.08mm/px · 13 of 35 slices shown]
[im 1/35]
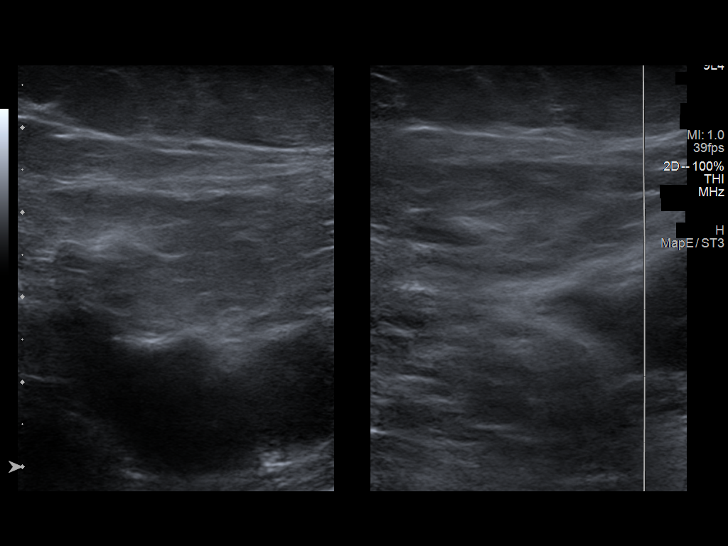
[im 3/35]
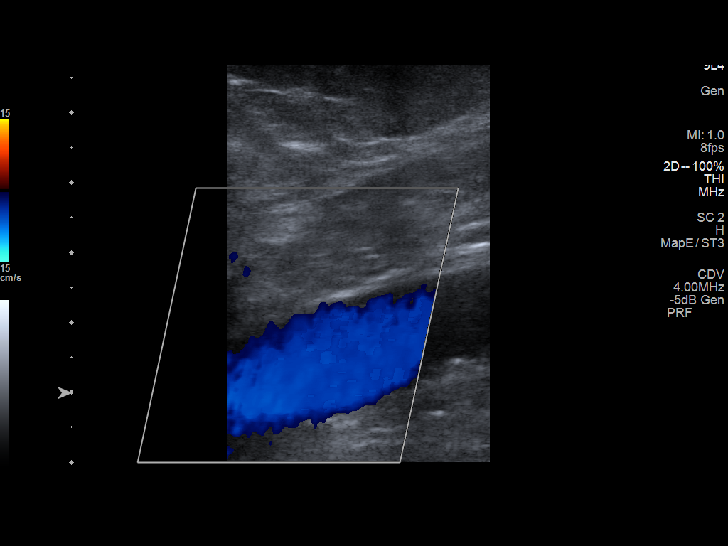
[im 6/35]
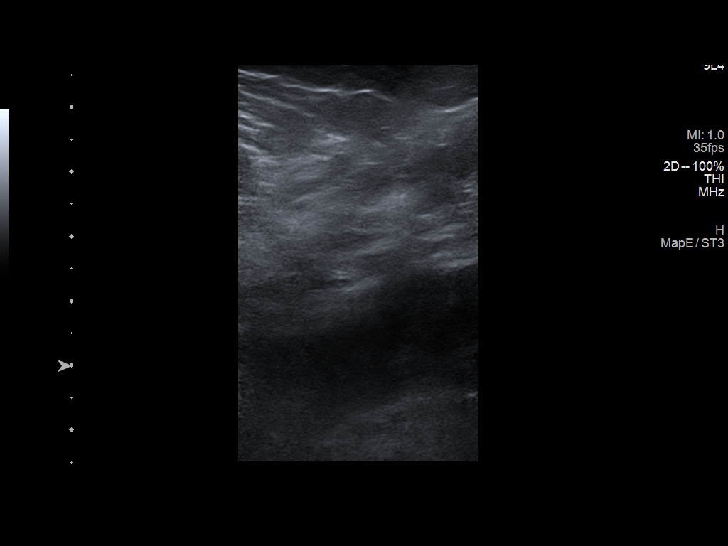
[im 9/35]
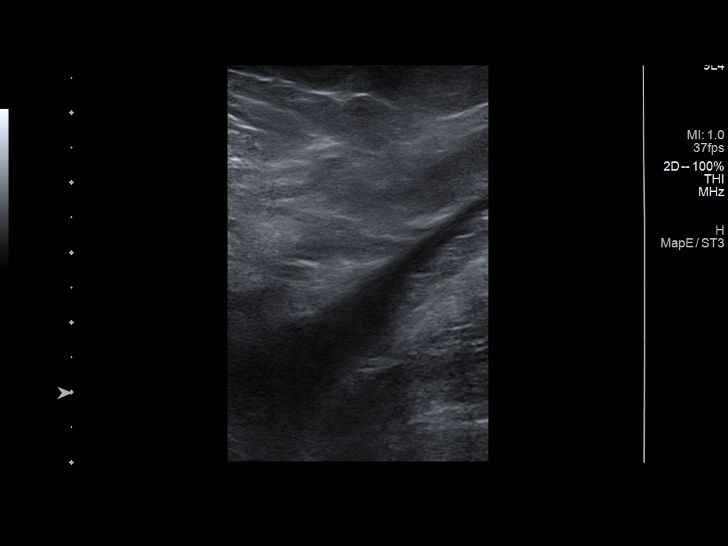
[im 12/35]
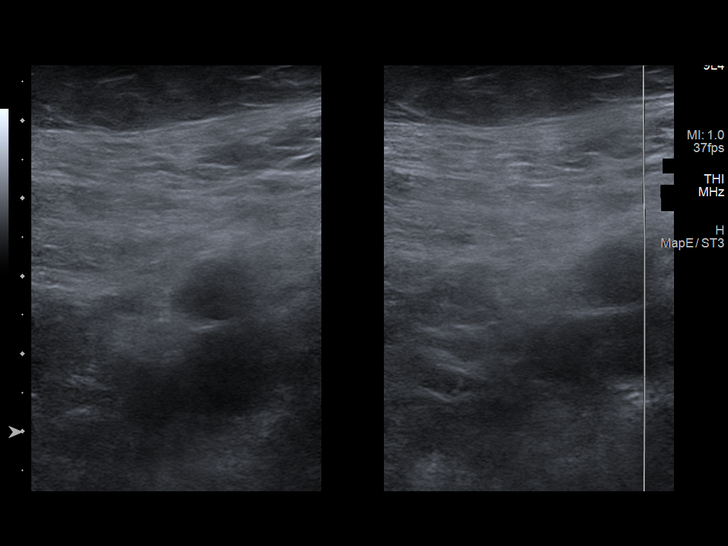
[im 15/35]
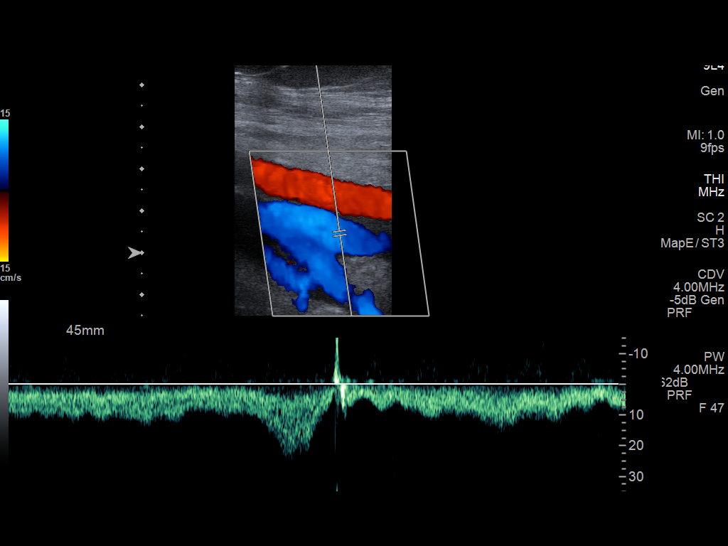
[im 18/35]
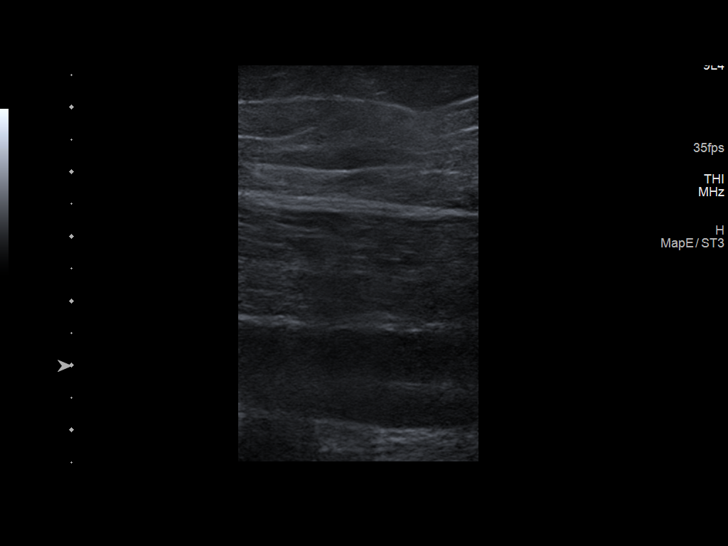
[im 20/35]
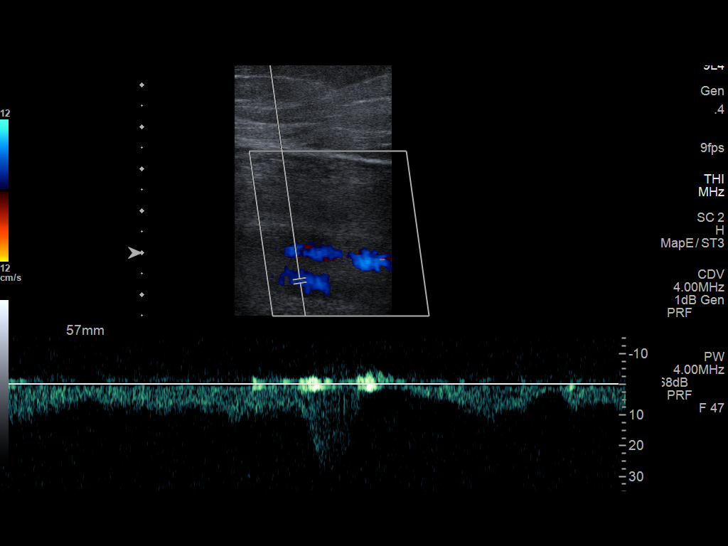
[im 23/35]
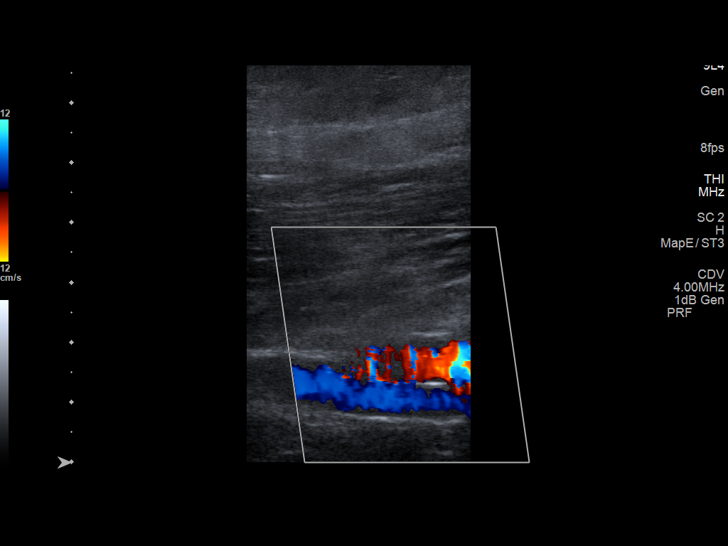
[im 26/35]
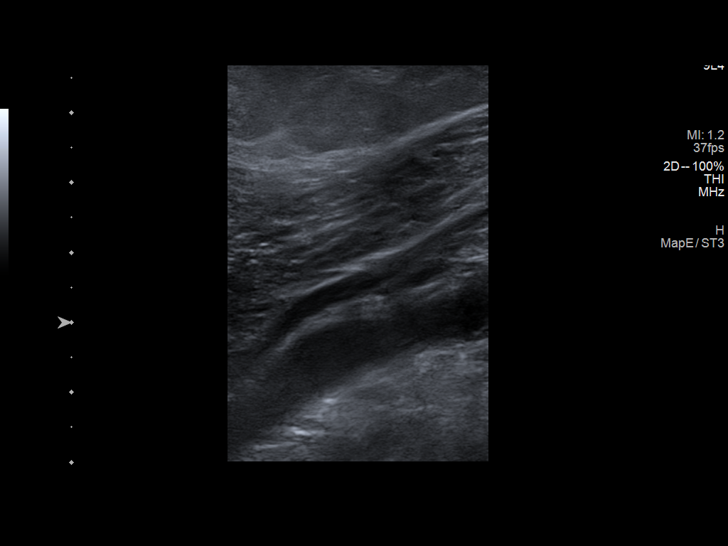
[im 29/35]
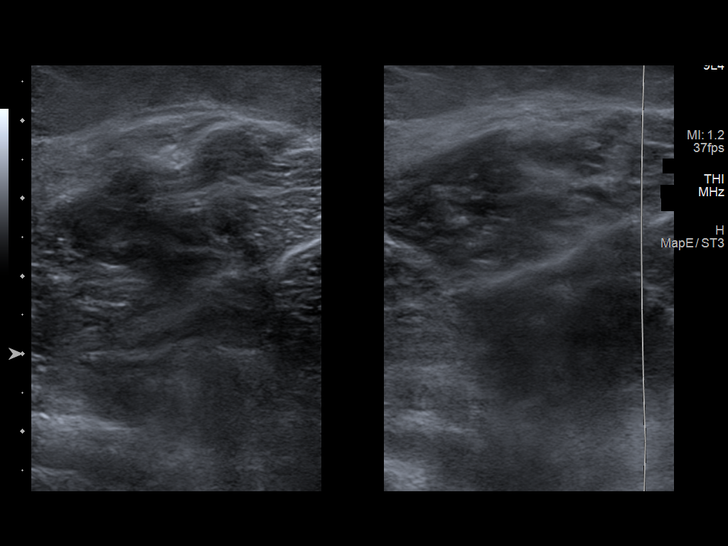
[im 32/35]
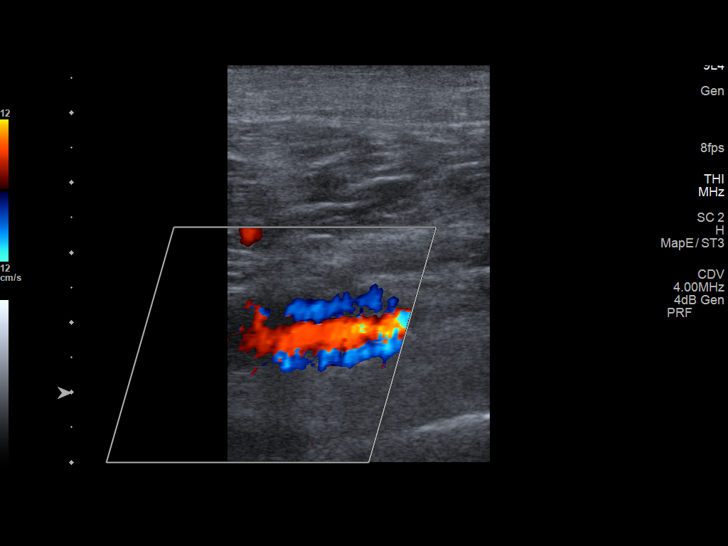
[im 35/35]
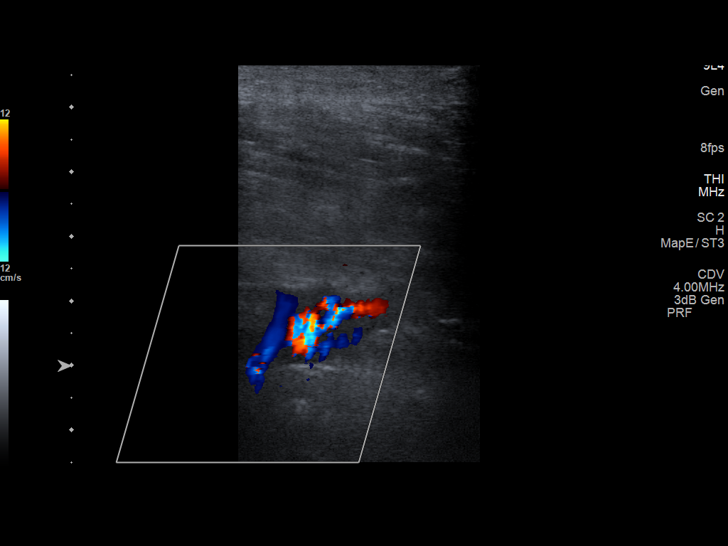

[13 of 24 positions shown; findings below may reference images not displayed]

FINDINGS: Contralateral Common Femoral Vein: Respiratory phasicity is normal
and symmetric with the symptomatic side. No evidence of thrombus.
Normal compressibility.

Common Femoral Vein: No evidence of thrombus. Normal
compressibility, respiratory phasicity and response to augmentation.

Saphenofemoral Junction: No evidence of thrombus. Normal
compressibility and flow on color Doppler imaging.

Profunda Femoral Vein: No evidence of thrombus. Normal
compressibility and flow on color Doppler imaging.

Femoral Vein: No evidence of thrombus. Normal compressibility,
respiratory phasicity and response to augmentation.

Popliteal Vein: No evidence of thrombus. Normal compressibility,
respiratory phasicity and response to augmentation.

Calf Veins: No evidence of thrombus. Normal compressibility and flow
on color Doppler imaging.

Superficial Great Saphenous Vein: No evidence of thrombus. Normal
compressibility.

Venous Reflux:  None.

Other Findings: No evidence of superficial thrombophlebitis or
abnormal fluid collection.
IMPRESSION: No evidence of right lower extremity deep venous thrombosis.

## 2022-11-13 DIAGNOSIS — I69354 Hemiplegia and hemiparesis following cerebral infarction affecting left non-dominant side: Secondary | ICD-10-CM | POA: Diagnosis not present

## 2022-11-13 DIAGNOSIS — M6281 Muscle weakness (generalized): Secondary | ICD-10-CM | POA: Diagnosis not present

## 2022-11-17 DIAGNOSIS — I69354 Hemiplegia and hemiparesis following cerebral infarction affecting left non-dominant side: Secondary | ICD-10-CM | POA: Diagnosis not present

## 2022-11-17 DIAGNOSIS — M6281 Muscle weakness (generalized): Secondary | ICD-10-CM | POA: Diagnosis not present

## 2022-11-20 DIAGNOSIS — M6281 Muscle weakness (generalized): Secondary | ICD-10-CM | POA: Diagnosis not present

## 2022-11-20 DIAGNOSIS — I69354 Hemiplegia and hemiparesis following cerebral infarction affecting left non-dominant side: Secondary | ICD-10-CM | POA: Diagnosis not present

## 2022-11-24 DIAGNOSIS — M6281 Muscle weakness (generalized): Secondary | ICD-10-CM | POA: Diagnosis not present

## 2022-11-24 DIAGNOSIS — I69354 Hemiplegia and hemiparesis following cerebral infarction affecting left non-dominant side: Secondary | ICD-10-CM | POA: Diagnosis not present

## 2022-11-27 DIAGNOSIS — I69354 Hemiplegia and hemiparesis following cerebral infarction affecting left non-dominant side: Secondary | ICD-10-CM | POA: Diagnosis not present

## 2022-11-27 DIAGNOSIS — M6281 Muscle weakness (generalized): Secondary | ICD-10-CM | POA: Diagnosis not present

## 2022-12-01 DIAGNOSIS — I69354 Hemiplegia and hemiparesis following cerebral infarction affecting left non-dominant side: Secondary | ICD-10-CM | POA: Diagnosis not present

## 2022-12-01 DIAGNOSIS — M6281 Muscle weakness (generalized): Secondary | ICD-10-CM | POA: Diagnosis not present

## 2022-12-04 DIAGNOSIS — I69354 Hemiplegia and hemiparesis following cerebral infarction affecting left non-dominant side: Secondary | ICD-10-CM | POA: Diagnosis not present

## 2022-12-04 DIAGNOSIS — M6281 Muscle weakness (generalized): Secondary | ICD-10-CM | POA: Diagnosis not present

## 2022-12-07 DIAGNOSIS — G600 Hereditary motor and sensory neuropathy: Secondary | ICD-10-CM | POA: Diagnosis not present

## 2022-12-07 DIAGNOSIS — M792 Neuralgia and neuritis, unspecified: Secondary | ICD-10-CM | POA: Diagnosis not present

## 2022-12-07 DIAGNOSIS — M79671 Pain in right foot: Secondary | ICD-10-CM | POA: Diagnosis not present

## 2022-12-07 DIAGNOSIS — G5790 Unspecified mononeuropathy of unspecified lower limb: Secondary | ICD-10-CM | POA: Diagnosis not present

## 2022-12-07 DIAGNOSIS — M79672 Pain in left foot: Secondary | ICD-10-CM | POA: Diagnosis not present

## 2022-12-08 DIAGNOSIS — M6281 Muscle weakness (generalized): Secondary | ICD-10-CM | POA: Diagnosis not present

## 2022-12-08 DIAGNOSIS — I69354 Hemiplegia and hemiparesis following cerebral infarction affecting left non-dominant side: Secondary | ICD-10-CM | POA: Diagnosis not present

## 2022-12-11 DIAGNOSIS — I69354 Hemiplegia and hemiparesis following cerebral infarction affecting left non-dominant side: Secondary | ICD-10-CM | POA: Diagnosis not present

## 2022-12-11 DIAGNOSIS — M6281 Muscle weakness (generalized): Secondary | ICD-10-CM | POA: Diagnosis not present

## 2022-12-17 DIAGNOSIS — Z0000 Encounter for general adult medical examination without abnormal findings: Secondary | ICD-10-CM | POA: Diagnosis not present

## 2022-12-17 DIAGNOSIS — Z8673 Personal history of transient ischemic attack (TIA), and cerebral infarction without residual deficits: Secondary | ICD-10-CM | POA: Diagnosis not present

## 2022-12-17 DIAGNOSIS — I10 Essential (primary) hypertension: Secondary | ICD-10-CM | POA: Diagnosis not present

## 2022-12-17 DIAGNOSIS — Z6835 Body mass index (BMI) 35.0-35.9, adult: Secondary | ICD-10-CM | POA: Diagnosis not present

## 2023-01-05 DIAGNOSIS — M79672 Pain in left foot: Secondary | ICD-10-CM | POA: Diagnosis not present

## 2023-01-05 DIAGNOSIS — M79671 Pain in right foot: Secondary | ICD-10-CM | POA: Diagnosis not present

## 2023-01-05 DIAGNOSIS — M792 Neuralgia and neuritis, unspecified: Secondary | ICD-10-CM | POA: Diagnosis not present

## 2023-01-05 DIAGNOSIS — G600 Hereditary motor and sensory neuropathy: Secondary | ICD-10-CM | POA: Diagnosis not present

## 2023-01-05 DIAGNOSIS — G5790 Unspecified mononeuropathy of unspecified lower limb: Secondary | ICD-10-CM | POA: Diagnosis not present

## 2023-02-03 DIAGNOSIS — G5790 Unspecified mononeuropathy of unspecified lower limb: Secondary | ICD-10-CM | POA: Diagnosis not present

## 2023-02-03 DIAGNOSIS — M792 Neuralgia and neuritis, unspecified: Secondary | ICD-10-CM | POA: Diagnosis not present

## 2023-02-03 DIAGNOSIS — M79672 Pain in left foot: Secondary | ICD-10-CM | POA: Diagnosis not present

## 2023-02-03 DIAGNOSIS — G600 Hereditary motor and sensory neuropathy: Secondary | ICD-10-CM | POA: Diagnosis not present

## 2023-02-03 DIAGNOSIS — M79671 Pain in right foot: Secondary | ICD-10-CM | POA: Diagnosis not present

## 2023-02-04 DIAGNOSIS — L110 Acquired keratosis follicularis: Secondary | ICD-10-CM | POA: Diagnosis not present

## 2023-02-04 DIAGNOSIS — I739 Peripheral vascular disease, unspecified: Secondary | ICD-10-CM | POA: Diagnosis not present

## 2023-02-04 DIAGNOSIS — M79672 Pain in left foot: Secondary | ICD-10-CM | POA: Diagnosis not present

## 2023-02-04 DIAGNOSIS — M79674 Pain in right toe(s): Secondary | ICD-10-CM | POA: Diagnosis not present

## 2023-02-04 DIAGNOSIS — M79675 Pain in left toe(s): Secondary | ICD-10-CM | POA: Diagnosis not present

## 2023-02-04 DIAGNOSIS — M79671 Pain in right foot: Secondary | ICD-10-CM | POA: Diagnosis not present

## 2023-02-17 DIAGNOSIS — M9901 Segmental and somatic dysfunction of cervical region: Secondary | ICD-10-CM | POA: Diagnosis not present

## 2023-02-17 DIAGNOSIS — M9903 Segmental and somatic dysfunction of lumbar region: Secondary | ICD-10-CM | POA: Diagnosis not present

## 2023-02-17 DIAGNOSIS — M9902 Segmental and somatic dysfunction of thoracic region: Secondary | ICD-10-CM | POA: Diagnosis not present

## 2023-02-17 DIAGNOSIS — M47816 Spondylosis without myelopathy or radiculopathy, lumbar region: Secondary | ICD-10-CM | POA: Diagnosis not present

## 2023-02-17 DIAGNOSIS — M47812 Spondylosis without myelopathy or radiculopathy, cervical region: Secondary | ICD-10-CM | POA: Diagnosis not present

## 2023-02-17 DIAGNOSIS — M546 Pain in thoracic spine: Secondary | ICD-10-CM | POA: Diagnosis not present

## 2023-02-22 DIAGNOSIS — M47812 Spondylosis without myelopathy or radiculopathy, cervical region: Secondary | ICD-10-CM | POA: Diagnosis not present

## 2023-02-22 DIAGNOSIS — M9902 Segmental and somatic dysfunction of thoracic region: Secondary | ICD-10-CM | POA: Diagnosis not present

## 2023-02-22 DIAGNOSIS — M47816 Spondylosis without myelopathy or radiculopathy, lumbar region: Secondary | ICD-10-CM | POA: Diagnosis not present

## 2023-02-22 DIAGNOSIS — M9903 Segmental and somatic dysfunction of lumbar region: Secondary | ICD-10-CM | POA: Diagnosis not present

## 2023-02-22 DIAGNOSIS — M9901 Segmental and somatic dysfunction of cervical region: Secondary | ICD-10-CM | POA: Diagnosis not present

## 2023-02-22 DIAGNOSIS — M546 Pain in thoracic spine: Secondary | ICD-10-CM | POA: Diagnosis not present

## 2023-02-25 DIAGNOSIS — M47816 Spondylosis without myelopathy or radiculopathy, lumbar region: Secondary | ICD-10-CM | POA: Diagnosis not present

## 2023-02-25 DIAGNOSIS — M47812 Spondylosis without myelopathy or radiculopathy, cervical region: Secondary | ICD-10-CM | POA: Diagnosis not present

## 2023-02-25 DIAGNOSIS — M9902 Segmental and somatic dysfunction of thoracic region: Secondary | ICD-10-CM | POA: Diagnosis not present

## 2023-02-25 DIAGNOSIS — M9901 Segmental and somatic dysfunction of cervical region: Secondary | ICD-10-CM | POA: Diagnosis not present

## 2023-02-25 DIAGNOSIS — M546 Pain in thoracic spine: Secondary | ICD-10-CM | POA: Diagnosis not present

## 2023-02-25 DIAGNOSIS — M9903 Segmental and somatic dysfunction of lumbar region: Secondary | ICD-10-CM | POA: Diagnosis not present

## 2023-03-03 DIAGNOSIS — M47816 Spondylosis without myelopathy or radiculopathy, lumbar region: Secondary | ICD-10-CM | POA: Diagnosis not present

## 2023-03-03 DIAGNOSIS — M9902 Segmental and somatic dysfunction of thoracic region: Secondary | ICD-10-CM | POA: Diagnosis not present

## 2023-03-03 DIAGNOSIS — M9903 Segmental and somatic dysfunction of lumbar region: Secondary | ICD-10-CM | POA: Diagnosis not present

## 2023-03-03 DIAGNOSIS — M9901 Segmental and somatic dysfunction of cervical region: Secondary | ICD-10-CM | POA: Diagnosis not present

## 2023-03-03 DIAGNOSIS — M546 Pain in thoracic spine: Secondary | ICD-10-CM | POA: Diagnosis not present

## 2023-03-03 DIAGNOSIS — M47812 Spondylosis without myelopathy or radiculopathy, cervical region: Secondary | ICD-10-CM | POA: Diagnosis not present

## 2023-03-05 DIAGNOSIS — M47816 Spondylosis without myelopathy or radiculopathy, lumbar region: Secondary | ICD-10-CM | POA: Diagnosis not present

## 2023-03-05 DIAGNOSIS — M9901 Segmental and somatic dysfunction of cervical region: Secondary | ICD-10-CM | POA: Diagnosis not present

## 2023-03-05 DIAGNOSIS — M9902 Segmental and somatic dysfunction of thoracic region: Secondary | ICD-10-CM | POA: Diagnosis not present

## 2023-03-05 DIAGNOSIS — M546 Pain in thoracic spine: Secondary | ICD-10-CM | POA: Diagnosis not present

## 2023-03-05 DIAGNOSIS — M9903 Segmental and somatic dysfunction of lumbar region: Secondary | ICD-10-CM | POA: Diagnosis not present

## 2023-03-05 DIAGNOSIS — M47812 Spondylosis without myelopathy or radiculopathy, cervical region: Secondary | ICD-10-CM | POA: Diagnosis not present

## 2023-03-08 DIAGNOSIS — M79671 Pain in right foot: Secondary | ICD-10-CM | POA: Diagnosis not present

## 2023-03-08 DIAGNOSIS — M79672 Pain in left foot: Secondary | ICD-10-CM | POA: Diagnosis not present

## 2023-03-08 DIAGNOSIS — G600 Hereditary motor and sensory neuropathy: Secondary | ICD-10-CM | POA: Diagnosis not present

## 2023-03-08 DIAGNOSIS — M792 Neuralgia and neuritis, unspecified: Secondary | ICD-10-CM | POA: Diagnosis not present

## 2023-03-08 DIAGNOSIS — G5790 Unspecified mononeuropathy of unspecified lower limb: Secondary | ICD-10-CM | POA: Diagnosis not present

## 2023-03-09 DIAGNOSIS — M47812 Spondylosis without myelopathy or radiculopathy, cervical region: Secondary | ICD-10-CM | POA: Diagnosis not present

## 2023-03-09 DIAGNOSIS — M47816 Spondylosis without myelopathy or radiculopathy, lumbar region: Secondary | ICD-10-CM | POA: Diagnosis not present

## 2023-03-09 DIAGNOSIS — M9901 Segmental and somatic dysfunction of cervical region: Secondary | ICD-10-CM | POA: Diagnosis not present

## 2023-03-09 DIAGNOSIS — M9903 Segmental and somatic dysfunction of lumbar region: Secondary | ICD-10-CM | POA: Diagnosis not present

## 2023-03-09 DIAGNOSIS — M9902 Segmental and somatic dysfunction of thoracic region: Secondary | ICD-10-CM | POA: Diagnosis not present

## 2023-03-09 DIAGNOSIS — M546 Pain in thoracic spine: Secondary | ICD-10-CM | POA: Diagnosis not present

## 2023-03-11 DIAGNOSIS — M9903 Segmental and somatic dysfunction of lumbar region: Secondary | ICD-10-CM | POA: Diagnosis not present

## 2023-03-11 DIAGNOSIS — M47816 Spondylosis without myelopathy or radiculopathy, lumbar region: Secondary | ICD-10-CM | POA: Diagnosis not present

## 2023-03-11 DIAGNOSIS — M9902 Segmental and somatic dysfunction of thoracic region: Secondary | ICD-10-CM | POA: Diagnosis not present

## 2023-03-11 DIAGNOSIS — M47812 Spondylosis without myelopathy or radiculopathy, cervical region: Secondary | ICD-10-CM | POA: Diagnosis not present

## 2023-03-11 DIAGNOSIS — M546 Pain in thoracic spine: Secondary | ICD-10-CM | POA: Diagnosis not present

## 2023-03-11 DIAGNOSIS — M9901 Segmental and somatic dysfunction of cervical region: Secondary | ICD-10-CM | POA: Diagnosis not present

## 2023-03-19 DIAGNOSIS — M47816 Spondylosis without myelopathy or radiculopathy, lumbar region: Secondary | ICD-10-CM | POA: Diagnosis not present

## 2023-03-19 DIAGNOSIS — M9902 Segmental and somatic dysfunction of thoracic region: Secondary | ICD-10-CM | POA: Diagnosis not present

## 2023-03-19 DIAGNOSIS — M9903 Segmental and somatic dysfunction of lumbar region: Secondary | ICD-10-CM | POA: Diagnosis not present

## 2023-03-19 DIAGNOSIS — M47812 Spondylosis without myelopathy or radiculopathy, cervical region: Secondary | ICD-10-CM | POA: Diagnosis not present

## 2023-03-19 DIAGNOSIS — M9901 Segmental and somatic dysfunction of cervical region: Secondary | ICD-10-CM | POA: Diagnosis not present

## 2023-03-19 DIAGNOSIS — M546 Pain in thoracic spine: Secondary | ICD-10-CM | POA: Diagnosis not present

## 2023-03-23 DIAGNOSIS — M9902 Segmental and somatic dysfunction of thoracic region: Secondary | ICD-10-CM | POA: Diagnosis not present

## 2023-03-23 DIAGNOSIS — M47816 Spondylosis without myelopathy or radiculopathy, lumbar region: Secondary | ICD-10-CM | POA: Diagnosis not present

## 2023-03-23 DIAGNOSIS — M546 Pain in thoracic spine: Secondary | ICD-10-CM | POA: Diagnosis not present

## 2023-03-23 DIAGNOSIS — M9903 Segmental and somatic dysfunction of lumbar region: Secondary | ICD-10-CM | POA: Diagnosis not present

## 2023-03-23 DIAGNOSIS — M47812 Spondylosis without myelopathy or radiculopathy, cervical region: Secondary | ICD-10-CM | POA: Diagnosis not present

## 2023-03-23 DIAGNOSIS — M9901 Segmental and somatic dysfunction of cervical region: Secondary | ICD-10-CM | POA: Diagnosis not present

## 2023-03-26 DIAGNOSIS — M9902 Segmental and somatic dysfunction of thoracic region: Secondary | ICD-10-CM | POA: Diagnosis not present

## 2023-03-26 DIAGNOSIS — M47812 Spondylosis without myelopathy or radiculopathy, cervical region: Secondary | ICD-10-CM | POA: Diagnosis not present

## 2023-03-26 DIAGNOSIS — M47816 Spondylosis without myelopathy or radiculopathy, lumbar region: Secondary | ICD-10-CM | POA: Diagnosis not present

## 2023-03-26 DIAGNOSIS — M546 Pain in thoracic spine: Secondary | ICD-10-CM | POA: Diagnosis not present

## 2023-03-26 DIAGNOSIS — M9903 Segmental and somatic dysfunction of lumbar region: Secondary | ICD-10-CM | POA: Diagnosis not present

## 2023-03-26 DIAGNOSIS — M9901 Segmental and somatic dysfunction of cervical region: Secondary | ICD-10-CM | POA: Diagnosis not present

## 2023-03-31 DIAGNOSIS — M9901 Segmental and somatic dysfunction of cervical region: Secondary | ICD-10-CM | POA: Diagnosis not present

## 2023-03-31 DIAGNOSIS — M9903 Segmental and somatic dysfunction of lumbar region: Secondary | ICD-10-CM | POA: Diagnosis not present

## 2023-03-31 DIAGNOSIS — M47816 Spondylosis without myelopathy or radiculopathy, lumbar region: Secondary | ICD-10-CM | POA: Diagnosis not present

## 2023-03-31 DIAGNOSIS — M9902 Segmental and somatic dysfunction of thoracic region: Secondary | ICD-10-CM | POA: Diagnosis not present

## 2023-03-31 DIAGNOSIS — M546 Pain in thoracic spine: Secondary | ICD-10-CM | POA: Diagnosis not present

## 2023-03-31 DIAGNOSIS — M47812 Spondylosis without myelopathy or radiculopathy, cervical region: Secondary | ICD-10-CM | POA: Diagnosis not present

## 2023-04-06 DIAGNOSIS — M79672 Pain in left foot: Secondary | ICD-10-CM | POA: Diagnosis not present

## 2023-04-06 DIAGNOSIS — M792 Neuralgia and neuritis, unspecified: Secondary | ICD-10-CM | POA: Diagnosis not present

## 2023-04-06 DIAGNOSIS — M79671 Pain in right foot: Secondary | ICD-10-CM | POA: Diagnosis not present

## 2023-04-06 DIAGNOSIS — G600 Hereditary motor and sensory neuropathy: Secondary | ICD-10-CM | POA: Diagnosis not present

## 2023-04-06 DIAGNOSIS — G5790 Unspecified mononeuropathy of unspecified lower limb: Secondary | ICD-10-CM | POA: Diagnosis not present

## 2023-04-07 DIAGNOSIS — M9902 Segmental and somatic dysfunction of thoracic region: Secondary | ICD-10-CM | POA: Diagnosis not present

## 2023-04-07 DIAGNOSIS — M9901 Segmental and somatic dysfunction of cervical region: Secondary | ICD-10-CM | POA: Diagnosis not present

## 2023-04-07 DIAGNOSIS — M47816 Spondylosis without myelopathy or radiculopathy, lumbar region: Secondary | ICD-10-CM | POA: Diagnosis not present

## 2023-04-07 DIAGNOSIS — M546 Pain in thoracic spine: Secondary | ICD-10-CM | POA: Diagnosis not present

## 2023-04-07 DIAGNOSIS — M9903 Segmental and somatic dysfunction of lumbar region: Secondary | ICD-10-CM | POA: Diagnosis not present

## 2023-04-07 DIAGNOSIS — M47812 Spondylosis without myelopathy or radiculopathy, cervical region: Secondary | ICD-10-CM | POA: Diagnosis not present

## 2023-04-16 DIAGNOSIS — M47816 Spondylosis without myelopathy or radiculopathy, lumbar region: Secondary | ICD-10-CM | POA: Diagnosis not present

## 2023-04-16 DIAGNOSIS — M9901 Segmental and somatic dysfunction of cervical region: Secondary | ICD-10-CM | POA: Diagnosis not present

## 2023-04-16 DIAGNOSIS — M9902 Segmental and somatic dysfunction of thoracic region: Secondary | ICD-10-CM | POA: Diagnosis not present

## 2023-04-16 DIAGNOSIS — M546 Pain in thoracic spine: Secondary | ICD-10-CM | POA: Diagnosis not present

## 2023-04-16 DIAGNOSIS — M47812 Spondylosis without myelopathy or radiculopathy, cervical region: Secondary | ICD-10-CM | POA: Diagnosis not present

## 2023-04-16 DIAGNOSIS — M9903 Segmental and somatic dysfunction of lumbar region: Secondary | ICD-10-CM | POA: Diagnosis not present

## 2023-04-20 DIAGNOSIS — M79671 Pain in right foot: Secondary | ICD-10-CM | POA: Diagnosis not present

## 2023-04-20 DIAGNOSIS — M79674 Pain in right toe(s): Secondary | ICD-10-CM | POA: Diagnosis not present

## 2023-04-20 DIAGNOSIS — I739 Peripheral vascular disease, unspecified: Secondary | ICD-10-CM | POA: Diagnosis not present

## 2023-04-20 DIAGNOSIS — L110 Acquired keratosis follicularis: Secondary | ICD-10-CM | POA: Diagnosis not present

## 2023-04-20 DIAGNOSIS — M79672 Pain in left foot: Secondary | ICD-10-CM | POA: Diagnosis not present

## 2023-04-20 DIAGNOSIS — M79675 Pain in left toe(s): Secondary | ICD-10-CM | POA: Diagnosis not present

## 2023-04-22 DIAGNOSIS — M47812 Spondylosis without myelopathy or radiculopathy, cervical region: Secondary | ICD-10-CM | POA: Diagnosis not present

## 2023-04-22 DIAGNOSIS — M9902 Segmental and somatic dysfunction of thoracic region: Secondary | ICD-10-CM | POA: Diagnosis not present

## 2023-04-22 DIAGNOSIS — M546 Pain in thoracic spine: Secondary | ICD-10-CM | POA: Diagnosis not present

## 2023-04-22 DIAGNOSIS — M9903 Segmental and somatic dysfunction of lumbar region: Secondary | ICD-10-CM | POA: Diagnosis not present

## 2023-04-22 DIAGNOSIS — M9901 Segmental and somatic dysfunction of cervical region: Secondary | ICD-10-CM | POA: Diagnosis not present

## 2023-04-22 DIAGNOSIS — M47816 Spondylosis without myelopathy or radiculopathy, lumbar region: Secondary | ICD-10-CM | POA: Diagnosis not present

## 2023-04-28 DIAGNOSIS — Z0000 Encounter for general adult medical examination without abnormal findings: Secondary | ICD-10-CM | POA: Diagnosis not present

## 2023-04-28 DIAGNOSIS — Z6833 Body mass index (BMI) 33.0-33.9, adult: Secondary | ICD-10-CM | POA: Diagnosis not present

## 2023-04-28 DIAGNOSIS — I10 Essential (primary) hypertension: Secondary | ICD-10-CM | POA: Diagnosis not present

## 2023-04-28 DIAGNOSIS — Z8673 Personal history of transient ischemic attack (TIA), and cerebral infarction without residual deficits: Secondary | ICD-10-CM | POA: Diagnosis not present

## 2023-05-04 DIAGNOSIS — M79672 Pain in left foot: Secondary | ICD-10-CM | POA: Diagnosis not present

## 2023-05-04 DIAGNOSIS — G600 Hereditary motor and sensory neuropathy: Secondary | ICD-10-CM | POA: Diagnosis not present

## 2023-05-04 DIAGNOSIS — M792 Neuralgia and neuritis, unspecified: Secondary | ICD-10-CM | POA: Diagnosis not present

## 2023-05-04 DIAGNOSIS — G5790 Unspecified mononeuropathy of unspecified lower limb: Secondary | ICD-10-CM | POA: Diagnosis not present

## 2023-05-04 DIAGNOSIS — M79671 Pain in right foot: Secondary | ICD-10-CM | POA: Diagnosis not present

## 2023-05-06 DIAGNOSIS — M47812 Spondylosis without myelopathy or radiculopathy, cervical region: Secondary | ICD-10-CM | POA: Diagnosis not present

## 2023-05-06 DIAGNOSIS — M9903 Segmental and somatic dysfunction of lumbar region: Secondary | ICD-10-CM | POA: Diagnosis not present

## 2023-05-06 DIAGNOSIS — M47816 Spondylosis without myelopathy or radiculopathy, lumbar region: Secondary | ICD-10-CM | POA: Diagnosis not present

## 2023-05-06 DIAGNOSIS — M9902 Segmental and somatic dysfunction of thoracic region: Secondary | ICD-10-CM | POA: Diagnosis not present

## 2023-05-06 DIAGNOSIS — M546 Pain in thoracic spine: Secondary | ICD-10-CM | POA: Diagnosis not present

## 2023-05-06 DIAGNOSIS — M9901 Segmental and somatic dysfunction of cervical region: Secondary | ICD-10-CM | POA: Diagnosis not present

## 2023-05-20 DIAGNOSIS — M9901 Segmental and somatic dysfunction of cervical region: Secondary | ICD-10-CM | POA: Diagnosis not present

## 2023-05-20 DIAGNOSIS — M9902 Segmental and somatic dysfunction of thoracic region: Secondary | ICD-10-CM | POA: Diagnosis not present

## 2023-05-20 DIAGNOSIS — M47816 Spondylosis without myelopathy or radiculopathy, lumbar region: Secondary | ICD-10-CM | POA: Diagnosis not present

## 2023-05-20 DIAGNOSIS — M546 Pain in thoracic spine: Secondary | ICD-10-CM | POA: Diagnosis not present

## 2023-05-20 DIAGNOSIS — M47812 Spondylosis without myelopathy or radiculopathy, cervical region: Secondary | ICD-10-CM | POA: Diagnosis not present

## 2023-05-20 DIAGNOSIS — M9903 Segmental and somatic dysfunction of lumbar region: Secondary | ICD-10-CM | POA: Diagnosis not present

## 2023-06-01 DIAGNOSIS — G600 Hereditary motor and sensory neuropathy: Secondary | ICD-10-CM | POA: Diagnosis not present

## 2023-06-01 DIAGNOSIS — G5790 Unspecified mononeuropathy of unspecified lower limb: Secondary | ICD-10-CM | POA: Diagnosis not present

## 2023-06-01 DIAGNOSIS — M79672 Pain in left foot: Secondary | ICD-10-CM | POA: Diagnosis not present

## 2023-06-01 DIAGNOSIS — M79671 Pain in right foot: Secondary | ICD-10-CM | POA: Diagnosis not present

## 2023-06-01 DIAGNOSIS — M792 Neuralgia and neuritis, unspecified: Secondary | ICD-10-CM | POA: Diagnosis not present

## 2023-06-03 DIAGNOSIS — L600 Ingrowing nail: Secondary | ICD-10-CM | POA: Diagnosis not present

## 2023-06-03 DIAGNOSIS — L03032 Cellulitis of left toe: Secondary | ICD-10-CM | POA: Diagnosis not present

## 2023-06-03 DIAGNOSIS — M79671 Pain in right foot: Secondary | ICD-10-CM | POA: Diagnosis not present

## 2023-06-03 DIAGNOSIS — L03031 Cellulitis of right toe: Secondary | ICD-10-CM | POA: Diagnosis not present

## 2023-06-03 DIAGNOSIS — M79674 Pain in right toe(s): Secondary | ICD-10-CM | POA: Diagnosis not present

## 2023-06-03 DIAGNOSIS — M79672 Pain in left foot: Secondary | ICD-10-CM | POA: Diagnosis not present

## 2023-06-03 DIAGNOSIS — M79675 Pain in left toe(s): Secondary | ICD-10-CM | POA: Diagnosis not present

## 2023-06-10 DIAGNOSIS — M9901 Segmental and somatic dysfunction of cervical region: Secondary | ICD-10-CM | POA: Diagnosis not present

## 2023-06-10 DIAGNOSIS — M47812 Spondylosis without myelopathy or radiculopathy, cervical region: Secondary | ICD-10-CM | POA: Diagnosis not present

## 2023-06-10 DIAGNOSIS — M9902 Segmental and somatic dysfunction of thoracic region: Secondary | ICD-10-CM | POA: Diagnosis not present

## 2023-06-10 DIAGNOSIS — M546 Pain in thoracic spine: Secondary | ICD-10-CM | POA: Diagnosis not present

## 2023-06-10 DIAGNOSIS — M47816 Spondylosis without myelopathy or radiculopathy, lumbar region: Secondary | ICD-10-CM | POA: Diagnosis not present

## 2023-06-10 DIAGNOSIS — M9903 Segmental and somatic dysfunction of lumbar region: Secondary | ICD-10-CM | POA: Diagnosis not present

## 2023-06-14 DIAGNOSIS — R600 Localized edema: Secondary | ICD-10-CM | POA: Diagnosis not present

## 2023-06-14 DIAGNOSIS — I10 Essential (primary) hypertension: Secondary | ICD-10-CM | POA: Diagnosis not present

## 2023-06-14 DIAGNOSIS — Z6834 Body mass index (BMI) 34.0-34.9, adult: Secondary | ICD-10-CM | POA: Diagnosis not present

## 2023-06-14 DIAGNOSIS — Z8673 Personal history of transient ischemic attack (TIA), and cerebral infarction without residual deficits: Secondary | ICD-10-CM | POA: Diagnosis not present

## 2023-06-14 DIAGNOSIS — I87332 Chronic venous hypertension (idiopathic) with ulcer and inflammation of left lower extremity: Secondary | ICD-10-CM | POA: Diagnosis not present

## 2023-06-17 DIAGNOSIS — L03031 Cellulitis of right toe: Secondary | ICD-10-CM | POA: Diagnosis not present

## 2023-06-17 DIAGNOSIS — M79674 Pain in right toe(s): Secondary | ICD-10-CM | POA: Diagnosis not present

## 2023-06-17 DIAGNOSIS — M79671 Pain in right foot: Secondary | ICD-10-CM | POA: Diagnosis not present

## 2023-06-17 DIAGNOSIS — M79672 Pain in left foot: Secondary | ICD-10-CM | POA: Diagnosis not present

## 2023-06-17 DIAGNOSIS — L03032 Cellulitis of left toe: Secondary | ICD-10-CM | POA: Diagnosis not present

## 2023-06-17 DIAGNOSIS — M79675 Pain in left toe(s): Secondary | ICD-10-CM | POA: Diagnosis not present

## 2023-06-24 DIAGNOSIS — M9902 Segmental and somatic dysfunction of thoracic region: Secondary | ICD-10-CM | POA: Diagnosis not present

## 2023-06-24 DIAGNOSIS — M9903 Segmental and somatic dysfunction of lumbar region: Secondary | ICD-10-CM | POA: Diagnosis not present

## 2023-06-24 DIAGNOSIS — M546 Pain in thoracic spine: Secondary | ICD-10-CM | POA: Diagnosis not present

## 2023-06-24 DIAGNOSIS — M9901 Segmental and somatic dysfunction of cervical region: Secondary | ICD-10-CM | POA: Diagnosis not present

## 2023-06-24 DIAGNOSIS — M47812 Spondylosis without myelopathy or radiculopathy, cervical region: Secondary | ICD-10-CM | POA: Diagnosis not present

## 2023-06-24 DIAGNOSIS — M47816 Spondylosis without myelopathy or radiculopathy, lumbar region: Secondary | ICD-10-CM | POA: Diagnosis not present

## 2023-06-28 DIAGNOSIS — I781 Nevus, non-neoplastic: Secondary | ICD-10-CM | POA: Diagnosis not present

## 2023-06-28 DIAGNOSIS — D485 Neoplasm of uncertain behavior of skin: Secondary | ICD-10-CM | POA: Diagnosis not present

## 2023-06-28 DIAGNOSIS — Z1283 Encounter for screening for malignant neoplasm of skin: Secondary | ICD-10-CM | POA: Diagnosis not present

## 2023-06-29 DIAGNOSIS — M79671 Pain in right foot: Secondary | ICD-10-CM | POA: Diagnosis not present

## 2023-06-29 DIAGNOSIS — M792 Neuralgia and neuritis, unspecified: Secondary | ICD-10-CM | POA: Diagnosis not present

## 2023-06-29 DIAGNOSIS — G5790 Unspecified mononeuropathy of unspecified lower limb: Secondary | ICD-10-CM | POA: Diagnosis not present

## 2023-06-29 DIAGNOSIS — M79672 Pain in left foot: Secondary | ICD-10-CM | POA: Diagnosis not present

## 2023-06-29 DIAGNOSIS — G600 Hereditary motor and sensory neuropathy: Secondary | ICD-10-CM | POA: Diagnosis not present

## 2023-07-07 DIAGNOSIS — M9902 Segmental and somatic dysfunction of thoracic region: Secondary | ICD-10-CM | POA: Diagnosis not present

## 2023-07-07 DIAGNOSIS — M47812 Spondylosis without myelopathy or radiculopathy, cervical region: Secondary | ICD-10-CM | POA: Diagnosis not present

## 2023-07-07 DIAGNOSIS — M47816 Spondylosis without myelopathy or radiculopathy, lumbar region: Secondary | ICD-10-CM | POA: Diagnosis not present

## 2023-07-07 DIAGNOSIS — M9903 Segmental and somatic dysfunction of lumbar region: Secondary | ICD-10-CM | POA: Diagnosis not present

## 2023-07-07 DIAGNOSIS — M9901 Segmental and somatic dysfunction of cervical region: Secondary | ICD-10-CM | POA: Diagnosis not present

## 2023-07-07 DIAGNOSIS — M546 Pain in thoracic spine: Secondary | ICD-10-CM | POA: Diagnosis not present

## 2023-07-13 DIAGNOSIS — M79674 Pain in right toe(s): Secondary | ICD-10-CM | POA: Diagnosis not present

## 2023-07-13 DIAGNOSIS — M792 Neuralgia and neuritis, unspecified: Secondary | ICD-10-CM | POA: Diagnosis not present

## 2023-07-13 DIAGNOSIS — M79672 Pain in left foot: Secondary | ICD-10-CM | POA: Diagnosis not present

## 2023-07-13 DIAGNOSIS — I739 Peripheral vascular disease, unspecified: Secondary | ICD-10-CM | POA: Diagnosis not present

## 2023-07-13 DIAGNOSIS — L110 Acquired keratosis follicularis: Secondary | ICD-10-CM | POA: Diagnosis not present

## 2023-07-13 DIAGNOSIS — M79671 Pain in right foot: Secondary | ICD-10-CM | POA: Diagnosis not present

## 2023-07-13 DIAGNOSIS — M79675 Pain in left toe(s): Secondary | ICD-10-CM | POA: Diagnosis not present

## 2023-07-21 DIAGNOSIS — M9902 Segmental and somatic dysfunction of thoracic region: Secondary | ICD-10-CM | POA: Diagnosis not present

## 2023-07-21 DIAGNOSIS — M9903 Segmental and somatic dysfunction of lumbar region: Secondary | ICD-10-CM | POA: Diagnosis not present

## 2023-07-21 DIAGNOSIS — M47812 Spondylosis without myelopathy or radiculopathy, cervical region: Secondary | ICD-10-CM | POA: Diagnosis not present

## 2023-07-21 DIAGNOSIS — M546 Pain in thoracic spine: Secondary | ICD-10-CM | POA: Diagnosis not present

## 2023-07-21 DIAGNOSIS — M47816 Spondylosis without myelopathy or radiculopathy, lumbar region: Secondary | ICD-10-CM | POA: Diagnosis not present

## 2023-07-21 DIAGNOSIS — M9901 Segmental and somatic dysfunction of cervical region: Secondary | ICD-10-CM | POA: Diagnosis not present

## 2023-07-27 DIAGNOSIS — M792 Neuralgia and neuritis, unspecified: Secondary | ICD-10-CM | POA: Diagnosis not present

## 2023-07-27 DIAGNOSIS — M79671 Pain in right foot: Secondary | ICD-10-CM | POA: Diagnosis not present

## 2023-07-27 DIAGNOSIS — G600 Hereditary motor and sensory neuropathy: Secondary | ICD-10-CM | POA: Diagnosis not present

## 2023-07-27 DIAGNOSIS — M79672 Pain in left foot: Secondary | ICD-10-CM | POA: Diagnosis not present

## 2023-07-27 DIAGNOSIS — G5790 Unspecified mononeuropathy of unspecified lower limb: Secondary | ICD-10-CM | POA: Diagnosis not present

## 2023-08-06 DIAGNOSIS — M9902 Segmental and somatic dysfunction of thoracic region: Secondary | ICD-10-CM | POA: Diagnosis not present

## 2023-08-06 DIAGNOSIS — M9903 Segmental and somatic dysfunction of lumbar region: Secondary | ICD-10-CM | POA: Diagnosis not present

## 2023-08-06 DIAGNOSIS — M47816 Spondylosis without myelopathy or radiculopathy, lumbar region: Secondary | ICD-10-CM | POA: Diagnosis not present

## 2023-08-06 DIAGNOSIS — M47812 Spondylosis without myelopathy or radiculopathy, cervical region: Secondary | ICD-10-CM | POA: Diagnosis not present

## 2023-08-06 DIAGNOSIS — M9901 Segmental and somatic dysfunction of cervical region: Secondary | ICD-10-CM | POA: Diagnosis not present

## 2023-08-06 DIAGNOSIS — M546 Pain in thoracic spine: Secondary | ICD-10-CM | POA: Diagnosis not present

## 2023-08-24 DIAGNOSIS — M79671 Pain in right foot: Secondary | ICD-10-CM | POA: Diagnosis not present

## 2023-08-24 DIAGNOSIS — M792 Neuralgia and neuritis, unspecified: Secondary | ICD-10-CM | POA: Diagnosis not present

## 2023-08-24 DIAGNOSIS — M79672 Pain in left foot: Secondary | ICD-10-CM | POA: Diagnosis not present

## 2023-08-24 DIAGNOSIS — G5790 Unspecified mononeuropathy of unspecified lower limb: Secondary | ICD-10-CM | POA: Diagnosis not present

## 2023-08-24 DIAGNOSIS — G600 Hereditary motor and sensory neuropathy: Secondary | ICD-10-CM | POA: Diagnosis not present

## 2023-08-25 DIAGNOSIS — H6123 Impacted cerumen, bilateral: Secondary | ICD-10-CM | POA: Diagnosis not present

## 2023-08-25 DIAGNOSIS — I10 Essential (primary) hypertension: Secondary | ICD-10-CM | POA: Diagnosis not present

## 2023-08-25 DIAGNOSIS — Z8673 Personal history of transient ischemic attack (TIA), and cerebral infarction without residual deficits: Secondary | ICD-10-CM | POA: Diagnosis not present

## 2023-08-25 DIAGNOSIS — Z0000 Encounter for general adult medical examination without abnormal findings: Secondary | ICD-10-CM | POA: Diagnosis not present

## 2023-08-25 DIAGNOSIS — Z6833 Body mass index (BMI) 33.0-33.9, adult: Secondary | ICD-10-CM | POA: Diagnosis not present

## 2023-08-26 ENCOUNTER — Encounter (INDEPENDENT_AMBULATORY_CARE_PROVIDER_SITE_OTHER): Payer: Self-pay | Admitting: *Deleted

## 2023-09-21 DIAGNOSIS — M792 Neuralgia and neuritis, unspecified: Secondary | ICD-10-CM | POA: Diagnosis not present

## 2023-09-21 DIAGNOSIS — G600 Hereditary motor and sensory neuropathy: Secondary | ICD-10-CM | POA: Diagnosis not present

## 2023-09-21 DIAGNOSIS — M79672 Pain in left foot: Secondary | ICD-10-CM | POA: Diagnosis not present

## 2023-09-21 DIAGNOSIS — M79671 Pain in right foot: Secondary | ICD-10-CM | POA: Diagnosis not present

## 2023-09-21 DIAGNOSIS — G5790 Unspecified mononeuropathy of unspecified lower limb: Secondary | ICD-10-CM | POA: Diagnosis not present

## 2023-09-27 DIAGNOSIS — M79671 Pain in right foot: Secondary | ICD-10-CM | POA: Diagnosis not present

## 2023-09-27 DIAGNOSIS — M79672 Pain in left foot: Secondary | ICD-10-CM | POA: Diagnosis not present

## 2023-09-27 DIAGNOSIS — M792 Neuralgia and neuritis, unspecified: Secondary | ICD-10-CM | POA: Diagnosis not present

## 2023-09-27 DIAGNOSIS — L110 Acquired keratosis follicularis: Secondary | ICD-10-CM | POA: Diagnosis not present

## 2023-09-27 DIAGNOSIS — I739 Peripheral vascular disease, unspecified: Secondary | ICD-10-CM | POA: Diagnosis not present

## 2023-10-19 DIAGNOSIS — M79671 Pain in right foot: Secondary | ICD-10-CM | POA: Diagnosis not present

## 2023-10-19 DIAGNOSIS — M79672 Pain in left foot: Secondary | ICD-10-CM | POA: Diagnosis not present

## 2023-10-19 DIAGNOSIS — M792 Neuralgia and neuritis, unspecified: Secondary | ICD-10-CM | POA: Diagnosis not present

## 2023-10-19 DIAGNOSIS — G5790 Unspecified mononeuropathy of unspecified lower limb: Secondary | ICD-10-CM | POA: Diagnosis not present

## 2023-10-19 DIAGNOSIS — G600 Hereditary motor and sensory neuropathy: Secondary | ICD-10-CM | POA: Diagnosis not present

## 2023-10-21 DIAGNOSIS — M85851 Other specified disorders of bone density and structure, right thigh: Secondary | ICD-10-CM | POA: Diagnosis not present

## 2023-10-21 DIAGNOSIS — M8588 Other specified disorders of bone density and structure, other site: Secondary | ICD-10-CM | POA: Diagnosis not present

## 2023-10-21 DIAGNOSIS — M85852 Other specified disorders of bone density and structure, left thigh: Secondary | ICD-10-CM | POA: Diagnosis not present

## 2023-10-21 DIAGNOSIS — M810 Age-related osteoporosis without current pathological fracture: Secondary | ICD-10-CM | POA: Diagnosis not present

## 2023-10-21 DIAGNOSIS — Z13820 Encounter for screening for osteoporosis: Secondary | ICD-10-CM | POA: Diagnosis not present

## 2023-10-21 DIAGNOSIS — M8589 Other specified disorders of bone density and structure, multiple sites: Secondary | ICD-10-CM | POA: Diagnosis not present

## 2023-11-16 DIAGNOSIS — G5790 Unspecified mononeuropathy of unspecified lower limb: Secondary | ICD-10-CM | POA: Diagnosis not present

## 2023-11-16 DIAGNOSIS — M79672 Pain in left foot: Secondary | ICD-10-CM | POA: Diagnosis not present

## 2023-11-16 DIAGNOSIS — M79671 Pain in right foot: Secondary | ICD-10-CM | POA: Diagnosis not present

## 2023-11-16 DIAGNOSIS — G600 Hereditary motor and sensory neuropathy: Secondary | ICD-10-CM | POA: Diagnosis not present

## 2023-11-16 DIAGNOSIS — M792 Neuralgia and neuritis, unspecified: Secondary | ICD-10-CM | POA: Diagnosis not present

## 2023-12-03 DIAGNOSIS — I10 Essential (primary) hypertension: Secondary | ICD-10-CM | POA: Diagnosis not present

## 2023-12-03 DIAGNOSIS — Z6832 Body mass index (BMI) 32.0-32.9, adult: Secondary | ICD-10-CM | POA: Diagnosis not present

## 2023-12-03 DIAGNOSIS — Z8673 Personal history of transient ischemic attack (TIA), and cerebral infarction without residual deficits: Secondary | ICD-10-CM | POA: Diagnosis not present

## 2023-12-03 DIAGNOSIS — Z0000 Encounter for general adult medical examination without abnormal findings: Secondary | ICD-10-CM | POA: Diagnosis not present

## 2023-12-07 DIAGNOSIS — Z13810 Encounter for screening for upper gastrointestinal disorder: Secondary | ICD-10-CM | POA: Diagnosis not present

## 2023-12-13 ENCOUNTER — Encounter (INDEPENDENT_AMBULATORY_CARE_PROVIDER_SITE_OTHER): Payer: Self-pay | Admitting: *Deleted

## 2023-12-14 DIAGNOSIS — M79672 Pain in left foot: Secondary | ICD-10-CM | POA: Diagnosis not present

## 2023-12-14 DIAGNOSIS — M79671 Pain in right foot: Secondary | ICD-10-CM | POA: Diagnosis not present

## 2023-12-14 DIAGNOSIS — G600 Hereditary motor and sensory neuropathy: Secondary | ICD-10-CM | POA: Diagnosis not present

## 2023-12-14 DIAGNOSIS — G5790 Unspecified mononeuropathy of unspecified lower limb: Secondary | ICD-10-CM | POA: Diagnosis not present

## 2023-12-14 DIAGNOSIS — M792 Neuralgia and neuritis, unspecified: Secondary | ICD-10-CM | POA: Diagnosis not present

## 2023-12-15 DIAGNOSIS — H401121 Primary open-angle glaucoma, left eye, mild stage: Secondary | ICD-10-CM | POA: Diagnosis not present

## 2023-12-28 DIAGNOSIS — I739 Peripheral vascular disease, unspecified: Secondary | ICD-10-CM | POA: Diagnosis not present

## 2023-12-30 DIAGNOSIS — L110 Acquired keratosis follicularis: Secondary | ICD-10-CM | POA: Diagnosis not present

## 2023-12-30 DIAGNOSIS — M79671 Pain in right foot: Secondary | ICD-10-CM | POA: Diagnosis not present

## 2023-12-30 DIAGNOSIS — I739 Peripheral vascular disease, unspecified: Secondary | ICD-10-CM | POA: Diagnosis not present

## 2023-12-30 DIAGNOSIS — M79674 Pain in right toe(s): Secondary | ICD-10-CM | POA: Diagnosis not present

## 2023-12-30 DIAGNOSIS — M79672 Pain in left foot: Secondary | ICD-10-CM | POA: Diagnosis not present

## 2023-12-30 DIAGNOSIS — M79675 Pain in left toe(s): Secondary | ICD-10-CM | POA: Diagnosis not present

## 2024-01-07 ENCOUNTER — Telehealth: Payer: Self-pay

## 2024-01-07 NOTE — Telephone Encounter (Signed)
 Who is your primary care physician: Dr.Xaje Hasanaji  Reasons for the colonoscopy: Screening  Have you had a colonoscopy before?  YES  Do you have family history of colon cancer? NO  Previous colonoscopy with polyps removed? NO  Do you have a history colorectal cancer?   NO  Are you diabetic? If yes, Type 1 or Type 2?    NO  Do you have a prosthetic or mechanical heart valve? NO  Do you have a pacemaker/defibrillator?   NO  Have you had endocarditis/atrial fibrillation? NO  Have you had joint replacement within the last 12 months?  NO  Do you tend to be constipated or have to use laxatives? NO  Do you have any history of drugs or alchohol?  NO  Do you use supplemental oxygen?  NO  Have you had a stroke or heart attack within the last 6 months? NO  Do you take weight loss medication?  NO    Do you take any blood-thinning medications such as: (aspirin, warfarin, Plavix, Aggrenox)  YES  If yes we need the name, milligram, dosage and who is prescribing doctor PLAVIX 75 MG DAILY DR.HASANAJ Current Outpatient Medications on File Prior to Visit  Medication Sig Dispense Refill   amLODipine (NORVASC) 5 MG tablet Take 10 mg by mouth daily.      Ascorbic Acid (VITAMIN C) 1000 MG tablet Take 1,000 mg by mouth daily.       aspirin 81 MG tablet Take 81 mg by mouth daily.       busPIRone  (BUSPAR ) 15 MG tablet Take 1 tablet (15 mg total) by mouth 3 (three) times daily. 90 tablet 3   cholecalciferol (VITAMIN D) 1000 UNITS tablet Take 1,000 Units by mouth daily.       co-enzyme Q-10 30 MG capsule Take 100 mg by mouth 3 (three) times daily.       DULoxetine (CYMBALTA) 60 MG capsule Take 60 mg by mouth 2 (two) times daily.      irbesartan (AVAPRO) 300 MG tablet Take 300 mg by mouth at bedtime.     irbesartan-hydrochlorothiazide (AVALIDE) 300-12.5 MG per tablet Take 1 tablet by mouth daily.     LORazepam (ATIVAN) 1 MG tablet Take 1 mg by mouth 2 (two) times daily.      meloxicam (MOBIC) 15  MG tablet Take 15 mg by mouth daily.     vitamin E (VITAMIN E) 400 UNIT capsule Take 400 Units by mouth daily.       No current facility-administered medications on file prior to visit.    No Known Allergies   Pharmacy: [facility]   Primary Insurance Name: Bernabe Brew [id number]  Best number where you can be reached: [phone]

## 2024-01-07 NOTE — Telephone Encounter (Signed)
 Room 1.  Please confirm with the patient if he is taking clopidogrel and if so please request clearance to hold with Dr. Lorraine Roses.  Thanks

## 2024-01-10 NOTE — Telephone Encounter (Signed)
 LMOVM to return call.

## 2024-01-11 DIAGNOSIS — M792 Neuralgia and neuritis, unspecified: Secondary | ICD-10-CM | POA: Diagnosis not present

## 2024-01-11 DIAGNOSIS — G5790 Unspecified mononeuropathy of unspecified lower limb: Secondary | ICD-10-CM | POA: Diagnosis not present

## 2024-01-11 DIAGNOSIS — M79671 Pain in right foot: Secondary | ICD-10-CM | POA: Diagnosis not present

## 2024-01-11 DIAGNOSIS — M79672 Pain in left foot: Secondary | ICD-10-CM | POA: Diagnosis not present

## 2024-01-11 DIAGNOSIS — G600 Hereditary motor and sensory neuropathy: Secondary | ICD-10-CM | POA: Diagnosis not present

## 2024-01-11 NOTE — Telephone Encounter (Signed)
 Thanks

## 2024-01-11 NOTE — Telephone Encounter (Signed)
 Clearance scanned into chart in media

## 2024-01-13 ENCOUNTER — Encounter (INDEPENDENT_AMBULATORY_CARE_PROVIDER_SITE_OTHER): Payer: Self-pay | Admitting: *Deleted

## 2024-01-19 NOTE — Telephone Encounter (Signed)
 LMTRC

## 2024-01-25 ENCOUNTER — Encounter: Payer: Self-pay | Admitting: *Deleted

## 2024-01-25 NOTE — Telephone Encounter (Signed)
[facility].. Will mail letter

## 2024-02-03 DIAGNOSIS — Z8673 Personal history of transient ischemic attack (TIA), and cerebral infarction without residual deficits: Secondary | ICD-10-CM | POA: Diagnosis not present

## 2024-02-03 DIAGNOSIS — R296 Repeated falls: Secondary | ICD-10-CM | POA: Diagnosis not present

## 2024-02-03 DIAGNOSIS — Z6830 Body mass index (BMI) 30.0-30.9, adult: Secondary | ICD-10-CM | POA: Diagnosis not present

## 2024-02-03 DIAGNOSIS — R630 Anorexia: Secondary | ICD-10-CM | POA: Diagnosis not present

## 2024-02-16 DIAGNOSIS — M79672 Pain in left foot: Secondary | ICD-10-CM | POA: Diagnosis not present

## 2024-02-16 DIAGNOSIS — M79671 Pain in right foot: Secondary | ICD-10-CM | POA: Diagnosis not present

## 2024-02-16 DIAGNOSIS — M792 Neuralgia and neuritis, unspecified: Secondary | ICD-10-CM | POA: Diagnosis not present

## 2024-02-16 DIAGNOSIS — G600 Hereditary motor and sensory neuropathy: Secondary | ICD-10-CM | POA: Diagnosis not present

## 2024-02-16 DIAGNOSIS — G5790 Unspecified mononeuropathy of unspecified lower limb: Secondary | ICD-10-CM | POA: Diagnosis not present

## 2024-02-21 DIAGNOSIS — M6281 Muscle weakness (generalized): Secondary | ICD-10-CM | POA: Diagnosis not present

## 2024-02-21 DIAGNOSIS — R262 Difficulty in walking, not elsewhere classified: Secondary | ICD-10-CM | POA: Diagnosis not present

## 2024-02-21 DIAGNOSIS — R296 Repeated falls: Secondary | ICD-10-CM | POA: Diagnosis not present

## 2024-02-21 DIAGNOSIS — I69354 Hemiplegia and hemiparesis following cerebral infarction affecting left non-dominant side: Secondary | ICD-10-CM | POA: Diagnosis not present

## 2024-02-23 DIAGNOSIS — M6281 Muscle weakness (generalized): Secondary | ICD-10-CM | POA: Diagnosis not present

## 2024-02-23 DIAGNOSIS — R262 Difficulty in walking, not elsewhere classified: Secondary | ICD-10-CM | POA: Diagnosis not present

## 2024-02-23 DIAGNOSIS — I69354 Hemiplegia and hemiparesis following cerebral infarction affecting left non-dominant side: Secondary | ICD-10-CM | POA: Diagnosis not present

## 2024-02-23 DIAGNOSIS — R296 Repeated falls: Secondary | ICD-10-CM | POA: Diagnosis not present

## 2024-03-01 DIAGNOSIS — I69354 Hemiplegia and hemiparesis following cerebral infarction affecting left non-dominant side: Secondary | ICD-10-CM | POA: Diagnosis not present

## 2024-03-01 DIAGNOSIS — R262 Difficulty in walking, not elsewhere classified: Secondary | ICD-10-CM | POA: Diagnosis not present

## 2024-03-01 DIAGNOSIS — M6281 Muscle weakness (generalized): Secondary | ICD-10-CM | POA: Diagnosis not present

## 2024-03-01 DIAGNOSIS — R296 Repeated falls: Secondary | ICD-10-CM | POA: Diagnosis not present

## 2024-03-03 DIAGNOSIS — R296 Repeated falls: Secondary | ICD-10-CM | POA: Diagnosis not present

## 2024-03-03 DIAGNOSIS — M6281 Muscle weakness (generalized): Secondary | ICD-10-CM | POA: Diagnosis not present

## 2024-03-03 DIAGNOSIS — I69354 Hemiplegia and hemiparesis following cerebral infarction affecting left non-dominant side: Secondary | ICD-10-CM | POA: Diagnosis not present

## 2024-03-03 DIAGNOSIS — R262 Difficulty in walking, not elsewhere classified: Secondary | ICD-10-CM | POA: Diagnosis not present

## 2024-03-06 DIAGNOSIS — G600 Hereditary motor and sensory neuropathy: Secondary | ICD-10-CM | POA: Diagnosis not present

## 2024-03-06 DIAGNOSIS — I739 Peripheral vascular disease, unspecified: Secondary | ICD-10-CM | POA: Diagnosis not present

## 2024-03-06 DIAGNOSIS — M79675 Pain in left toe(s): Secondary | ICD-10-CM | POA: Diagnosis not present

## 2024-03-06 DIAGNOSIS — M79674 Pain in right toe(s): Secondary | ICD-10-CM | POA: Diagnosis not present

## 2024-03-06 DIAGNOSIS — L110 Acquired keratosis follicularis: Secondary | ICD-10-CM | POA: Diagnosis not present

## 2024-03-06 DIAGNOSIS — M79672 Pain in left foot: Secondary | ICD-10-CM | POA: Diagnosis not present

## 2024-03-06 DIAGNOSIS — M79671 Pain in right foot: Secondary | ICD-10-CM | POA: Diagnosis not present

## 2024-03-08 DIAGNOSIS — M6281 Muscle weakness (generalized): Secondary | ICD-10-CM | POA: Diagnosis not present

## 2024-03-08 DIAGNOSIS — I69354 Hemiplegia and hemiparesis following cerebral infarction affecting left non-dominant side: Secondary | ICD-10-CM | POA: Diagnosis not present

## 2024-03-08 DIAGNOSIS — R296 Repeated falls: Secondary | ICD-10-CM | POA: Diagnosis not present

## 2024-03-08 DIAGNOSIS — R262 Difficulty in walking, not elsewhere classified: Secondary | ICD-10-CM | POA: Diagnosis not present

## 2024-03-10 DIAGNOSIS — R262 Difficulty in walking, not elsewhere classified: Secondary | ICD-10-CM | POA: Diagnosis not present

## 2024-03-10 DIAGNOSIS — I69354 Hemiplegia and hemiparesis following cerebral infarction affecting left non-dominant side: Secondary | ICD-10-CM | POA: Diagnosis not present

## 2024-03-10 DIAGNOSIS — M6281 Muscle weakness (generalized): Secondary | ICD-10-CM | POA: Diagnosis not present

## 2024-03-10 DIAGNOSIS — R296 Repeated falls: Secondary | ICD-10-CM | POA: Diagnosis not present

## 2024-03-15 DIAGNOSIS — I69354 Hemiplegia and hemiparesis following cerebral infarction affecting left non-dominant side: Secondary | ICD-10-CM | POA: Diagnosis not present

## 2024-03-15 DIAGNOSIS — R296 Repeated falls: Secondary | ICD-10-CM | POA: Diagnosis not present

## 2024-03-15 DIAGNOSIS — M6281 Muscle weakness (generalized): Secondary | ICD-10-CM | POA: Diagnosis not present

## 2024-03-15 DIAGNOSIS — R262 Difficulty in walking, not elsewhere classified: Secondary | ICD-10-CM | POA: Diagnosis not present

## 2024-03-17 DIAGNOSIS — R262 Difficulty in walking, not elsewhere classified: Secondary | ICD-10-CM | POA: Diagnosis not present

## 2024-03-17 DIAGNOSIS — I69354 Hemiplegia and hemiparesis following cerebral infarction affecting left non-dominant side: Secondary | ICD-10-CM | POA: Diagnosis not present

## 2024-03-17 DIAGNOSIS — M6281 Muscle weakness (generalized): Secondary | ICD-10-CM | POA: Diagnosis not present

## 2024-03-17 DIAGNOSIS — R296 Repeated falls: Secondary | ICD-10-CM | POA: Diagnosis not present

## 2024-03-22 DIAGNOSIS — M6281 Muscle weakness (generalized): Secondary | ICD-10-CM | POA: Diagnosis not present

## 2024-03-22 DIAGNOSIS — R262 Difficulty in walking, not elsewhere classified: Secondary | ICD-10-CM | POA: Diagnosis not present

## 2024-03-22 DIAGNOSIS — R296 Repeated falls: Secondary | ICD-10-CM | POA: Diagnosis not present

## 2024-03-22 DIAGNOSIS — I69354 Hemiplegia and hemiparesis following cerebral infarction affecting left non-dominant side: Secondary | ICD-10-CM | POA: Diagnosis not present

## 2024-03-23 DIAGNOSIS — M79672 Pain in left foot: Secondary | ICD-10-CM | POA: Diagnosis not present

## 2024-03-23 DIAGNOSIS — G600 Hereditary motor and sensory neuropathy: Secondary | ICD-10-CM | POA: Diagnosis not present

## 2024-03-23 DIAGNOSIS — G5790 Unspecified mononeuropathy of unspecified lower limb: Secondary | ICD-10-CM | POA: Diagnosis not present

## 2024-03-23 DIAGNOSIS — M79671 Pain in right foot: Secondary | ICD-10-CM | POA: Diagnosis not present

## 2024-03-23 DIAGNOSIS — M792 Neuralgia and neuritis, unspecified: Secondary | ICD-10-CM | POA: Diagnosis not present

## 2024-03-24 DIAGNOSIS — R296 Repeated falls: Secondary | ICD-10-CM | POA: Diagnosis not present

## 2024-03-24 DIAGNOSIS — M6281 Muscle weakness (generalized): Secondary | ICD-10-CM | POA: Diagnosis not present

## 2024-03-24 DIAGNOSIS — I69354 Hemiplegia and hemiparesis following cerebral infarction affecting left non-dominant side: Secondary | ICD-10-CM | POA: Diagnosis not present

## 2024-03-24 DIAGNOSIS — R262 Difficulty in walking, not elsewhere classified: Secondary | ICD-10-CM | POA: Diagnosis not present

## 2024-03-29 DIAGNOSIS — R262 Difficulty in walking, not elsewhere classified: Secondary | ICD-10-CM | POA: Diagnosis not present

## 2024-03-29 DIAGNOSIS — M6281 Muscle weakness (generalized): Secondary | ICD-10-CM | POA: Diagnosis not present

## 2024-03-29 DIAGNOSIS — I69354 Hemiplegia and hemiparesis following cerebral infarction affecting left non-dominant side: Secondary | ICD-10-CM | POA: Diagnosis not present

## 2024-03-29 DIAGNOSIS — R296 Repeated falls: Secondary | ICD-10-CM | POA: Diagnosis not present

## 2024-03-31 DIAGNOSIS — R262 Difficulty in walking, not elsewhere classified: Secondary | ICD-10-CM | POA: Diagnosis not present

## 2024-03-31 DIAGNOSIS — I69354 Hemiplegia and hemiparesis following cerebral infarction affecting left non-dominant side: Secondary | ICD-10-CM | POA: Diagnosis not present

## 2024-03-31 DIAGNOSIS — R296 Repeated falls: Secondary | ICD-10-CM | POA: Diagnosis not present

## 2024-03-31 DIAGNOSIS — M6281 Muscle weakness (generalized): Secondary | ICD-10-CM | POA: Diagnosis not present

## 2024-04-04 DIAGNOSIS — E782 Mixed hyperlipidemia: Secondary | ICD-10-CM | POA: Diagnosis not present

## 2024-04-04 DIAGNOSIS — R630 Anorexia: Secondary | ICD-10-CM | POA: Diagnosis not present

## 2024-04-04 DIAGNOSIS — Z6829 Body mass index (BMI) 29.0-29.9, adult: Secondary | ICD-10-CM | POA: Diagnosis not present

## 2024-04-04 DIAGNOSIS — R296 Repeated falls: Secondary | ICD-10-CM | POA: Diagnosis not present

## 2024-04-04 DIAGNOSIS — I10 Essential (primary) hypertension: Secondary | ICD-10-CM | POA: Diagnosis not present

## 2024-04-04 DIAGNOSIS — Z8673 Personal history of transient ischemic attack (TIA), and cerebral infarction without residual deficits: Secondary | ICD-10-CM | POA: Diagnosis not present

## 2024-04-05 DIAGNOSIS — R296 Repeated falls: Secondary | ICD-10-CM | POA: Diagnosis not present

## 2024-04-05 DIAGNOSIS — R262 Difficulty in walking, not elsewhere classified: Secondary | ICD-10-CM | POA: Diagnosis not present

## 2024-04-05 DIAGNOSIS — I69354 Hemiplegia and hemiparesis following cerebral infarction affecting left non-dominant side: Secondary | ICD-10-CM | POA: Diagnosis not present

## 2024-04-05 DIAGNOSIS — M6281 Muscle weakness (generalized): Secondary | ICD-10-CM | POA: Diagnosis not present

## 2024-04-07 DIAGNOSIS — R296 Repeated falls: Secondary | ICD-10-CM | POA: Diagnosis not present

## 2024-04-07 DIAGNOSIS — I69354 Hemiplegia and hemiparesis following cerebral infarction affecting left non-dominant side: Secondary | ICD-10-CM | POA: Diagnosis not present

## 2024-04-07 DIAGNOSIS — M6281 Muscle weakness (generalized): Secondary | ICD-10-CM | POA: Diagnosis not present

## 2024-04-07 DIAGNOSIS — R262 Difficulty in walking, not elsewhere classified: Secondary | ICD-10-CM | POA: Diagnosis not present

## 2024-04-12 DIAGNOSIS — R262 Difficulty in walking, not elsewhere classified: Secondary | ICD-10-CM | POA: Diagnosis not present

## 2024-04-12 DIAGNOSIS — I69354 Hemiplegia and hemiparesis following cerebral infarction affecting left non-dominant side: Secondary | ICD-10-CM | POA: Diagnosis not present

## 2024-04-12 DIAGNOSIS — M6281 Muscle weakness (generalized): Secondary | ICD-10-CM | POA: Diagnosis not present

## 2024-04-12 DIAGNOSIS — R296 Repeated falls: Secondary | ICD-10-CM | POA: Diagnosis not present

## 2024-04-14 DIAGNOSIS — M6281 Muscle weakness (generalized): Secondary | ICD-10-CM | POA: Diagnosis not present

## 2024-04-14 DIAGNOSIS — R262 Difficulty in walking, not elsewhere classified: Secondary | ICD-10-CM | POA: Diagnosis not present

## 2024-04-14 DIAGNOSIS — I69354 Hemiplegia and hemiparesis following cerebral infarction affecting left non-dominant side: Secondary | ICD-10-CM | POA: Diagnosis not present

## 2024-04-14 DIAGNOSIS — R296 Repeated falls: Secondary | ICD-10-CM | POA: Diagnosis not present

## 2024-04-19 DIAGNOSIS — I69354 Hemiplegia and hemiparesis following cerebral infarction affecting left non-dominant side: Secondary | ICD-10-CM | POA: Diagnosis not present

## 2024-04-19 DIAGNOSIS — R296 Repeated falls: Secondary | ICD-10-CM | POA: Diagnosis not present

## 2024-04-19 DIAGNOSIS — M6281 Muscle weakness (generalized): Secondary | ICD-10-CM | POA: Diagnosis not present

## 2024-04-19 DIAGNOSIS — R262 Difficulty in walking, not elsewhere classified: Secondary | ICD-10-CM | POA: Diagnosis not present

## 2024-04-21 DIAGNOSIS — I69354 Hemiplegia and hemiparesis following cerebral infarction affecting left non-dominant side: Secondary | ICD-10-CM | POA: Diagnosis not present

## 2024-04-21 DIAGNOSIS — M6281 Muscle weakness (generalized): Secondary | ICD-10-CM | POA: Diagnosis not present

## 2024-04-21 DIAGNOSIS — R262 Difficulty in walking, not elsewhere classified: Secondary | ICD-10-CM | POA: Diagnosis not present

## 2024-04-21 DIAGNOSIS — R296 Repeated falls: Secondary | ICD-10-CM | POA: Diagnosis not present

## 2024-04-26 DIAGNOSIS — I69354 Hemiplegia and hemiparesis following cerebral infarction affecting left non-dominant side: Secondary | ICD-10-CM | POA: Diagnosis not present

## 2024-04-26 DIAGNOSIS — M6281 Muscle weakness (generalized): Secondary | ICD-10-CM | POA: Diagnosis not present

## 2024-04-26 DIAGNOSIS — R262 Difficulty in walking, not elsewhere classified: Secondary | ICD-10-CM | POA: Diagnosis not present

## 2024-04-26 DIAGNOSIS — R296 Repeated falls: Secondary | ICD-10-CM | POA: Diagnosis not present

## 2024-04-28 DIAGNOSIS — R296 Repeated falls: Secondary | ICD-10-CM | POA: Diagnosis not present

## 2024-04-28 DIAGNOSIS — M6281 Muscle weakness (generalized): Secondary | ICD-10-CM | POA: Diagnosis not present

## 2024-04-28 DIAGNOSIS — I69354 Hemiplegia and hemiparesis following cerebral infarction affecting left non-dominant side: Secondary | ICD-10-CM | POA: Diagnosis not present

## 2024-04-28 DIAGNOSIS — R262 Difficulty in walking, not elsewhere classified: Secondary | ICD-10-CM | POA: Diagnosis not present

## 2024-05-03 DIAGNOSIS — I69354 Hemiplegia and hemiparesis following cerebral infarction affecting left non-dominant side: Secondary | ICD-10-CM | POA: Diagnosis not present

## 2024-05-03 DIAGNOSIS — R296 Repeated falls: Secondary | ICD-10-CM | POA: Diagnosis not present

## 2024-05-03 DIAGNOSIS — M6281 Muscle weakness (generalized): Secondary | ICD-10-CM | POA: Diagnosis not present

## 2024-05-03 DIAGNOSIS — R262 Difficulty in walking, not elsewhere classified: Secondary | ICD-10-CM | POA: Diagnosis not present

## 2024-05-05 DIAGNOSIS — R262 Difficulty in walking, not elsewhere classified: Secondary | ICD-10-CM | POA: Diagnosis not present

## 2024-05-05 DIAGNOSIS — M6281 Muscle weakness (generalized): Secondary | ICD-10-CM | POA: Diagnosis not present

## 2024-05-05 DIAGNOSIS — R296 Repeated falls: Secondary | ICD-10-CM | POA: Diagnosis not present

## 2024-05-05 DIAGNOSIS — I69354 Hemiplegia and hemiparesis following cerebral infarction affecting left non-dominant side: Secondary | ICD-10-CM | POA: Diagnosis not present

## 2024-05-10 DIAGNOSIS — R262 Difficulty in walking, not elsewhere classified: Secondary | ICD-10-CM | POA: Diagnosis not present

## 2024-05-10 DIAGNOSIS — I69354 Hemiplegia and hemiparesis following cerebral infarction affecting left non-dominant side: Secondary | ICD-10-CM | POA: Diagnosis not present

## 2024-05-10 DIAGNOSIS — R296 Repeated falls: Secondary | ICD-10-CM | POA: Diagnosis not present

## 2024-05-10 DIAGNOSIS — M6281 Muscle weakness (generalized): Secondary | ICD-10-CM | POA: Diagnosis not present

## 2024-05-12 DIAGNOSIS — R262 Difficulty in walking, not elsewhere classified: Secondary | ICD-10-CM | POA: Diagnosis not present

## 2024-05-12 DIAGNOSIS — I69354 Hemiplegia and hemiparesis following cerebral infarction affecting left non-dominant side: Secondary | ICD-10-CM | POA: Diagnosis not present

## 2024-05-12 DIAGNOSIS — R296 Repeated falls: Secondary | ICD-10-CM | POA: Diagnosis not present

## 2024-05-12 DIAGNOSIS — M6281 Muscle weakness (generalized): Secondary | ICD-10-CM | POA: Diagnosis not present

## 2024-05-17 DIAGNOSIS — M6281 Muscle weakness (generalized): Secondary | ICD-10-CM | POA: Diagnosis not present

## 2024-05-17 DIAGNOSIS — R262 Difficulty in walking, not elsewhere classified: Secondary | ICD-10-CM | POA: Diagnosis not present

## 2024-05-17 DIAGNOSIS — I69354 Hemiplegia and hemiparesis following cerebral infarction affecting left non-dominant side: Secondary | ICD-10-CM | POA: Diagnosis not present

## 2024-05-17 DIAGNOSIS — R296 Repeated falls: Secondary | ICD-10-CM | POA: Diagnosis not present

## 2024-05-19 DIAGNOSIS — R296 Repeated falls: Secondary | ICD-10-CM | POA: Diagnosis not present

## 2024-05-19 DIAGNOSIS — I69354 Hemiplegia and hemiparesis following cerebral infarction affecting left non-dominant side: Secondary | ICD-10-CM | POA: Diagnosis not present

## 2024-05-19 DIAGNOSIS — M6281 Muscle weakness (generalized): Secondary | ICD-10-CM | POA: Diagnosis not present

## 2024-05-19 DIAGNOSIS — R262 Difficulty in walking, not elsewhere classified: Secondary | ICD-10-CM | POA: Diagnosis not present

## 2024-05-24 DIAGNOSIS — R296 Repeated falls: Secondary | ICD-10-CM | POA: Diagnosis not present

## 2024-05-24 DIAGNOSIS — M6281 Muscle weakness (generalized): Secondary | ICD-10-CM | POA: Diagnosis not present

## 2024-05-24 DIAGNOSIS — R262 Difficulty in walking, not elsewhere classified: Secondary | ICD-10-CM | POA: Diagnosis not present

## 2024-05-24 DIAGNOSIS — I69354 Hemiplegia and hemiparesis following cerebral infarction affecting left non-dominant side: Secondary | ICD-10-CM | POA: Diagnosis not present

## 2024-05-26 DIAGNOSIS — I69354 Hemiplegia and hemiparesis following cerebral infarction affecting left non-dominant side: Secondary | ICD-10-CM | POA: Diagnosis not present

## 2024-05-26 DIAGNOSIS — R296 Repeated falls: Secondary | ICD-10-CM | POA: Diagnosis not present

## 2024-05-26 DIAGNOSIS — R262 Difficulty in walking, not elsewhere classified: Secondary | ICD-10-CM | POA: Diagnosis not present

## 2024-05-26 DIAGNOSIS — M6281 Muscle weakness (generalized): Secondary | ICD-10-CM | POA: Diagnosis not present

## 2024-05-31 DIAGNOSIS — R296 Repeated falls: Secondary | ICD-10-CM | POA: Diagnosis not present

## 2024-05-31 DIAGNOSIS — R262 Difficulty in walking, not elsewhere classified: Secondary | ICD-10-CM | POA: Diagnosis not present

## 2024-05-31 DIAGNOSIS — I69354 Hemiplegia and hemiparesis following cerebral infarction affecting left non-dominant side: Secondary | ICD-10-CM | POA: Diagnosis not present

## 2024-05-31 DIAGNOSIS — M6281 Muscle weakness (generalized): Secondary | ICD-10-CM | POA: Diagnosis not present

## 2024-06-02 DIAGNOSIS — M6281 Muscle weakness (generalized): Secondary | ICD-10-CM | POA: Diagnosis not present

## 2024-06-02 DIAGNOSIS — I69354 Hemiplegia and hemiparesis following cerebral infarction affecting left non-dominant side: Secondary | ICD-10-CM | POA: Diagnosis not present

## 2024-06-02 DIAGNOSIS — R262 Difficulty in walking, not elsewhere classified: Secondary | ICD-10-CM | POA: Diagnosis not present

## 2024-06-02 DIAGNOSIS — R296 Repeated falls: Secondary | ICD-10-CM | POA: Diagnosis not present

## 2024-06-07 DIAGNOSIS — R262 Difficulty in walking, not elsewhere classified: Secondary | ICD-10-CM | POA: Diagnosis not present

## 2024-06-07 DIAGNOSIS — I69354 Hemiplegia and hemiparesis following cerebral infarction affecting left non-dominant side: Secondary | ICD-10-CM | POA: Diagnosis not present

## 2024-06-07 DIAGNOSIS — M6281 Muscle weakness (generalized): Secondary | ICD-10-CM | POA: Diagnosis not present

## 2024-06-07 DIAGNOSIS — R296 Repeated falls: Secondary | ICD-10-CM | POA: Diagnosis not present

## 2024-06-08 DIAGNOSIS — I10 Essential (primary) hypertension: Secondary | ICD-10-CM | POA: Diagnosis not present

## 2024-06-08 DIAGNOSIS — E782 Mixed hyperlipidemia: Secondary | ICD-10-CM | POA: Diagnosis not present

## 2024-06-09 DIAGNOSIS — R296 Repeated falls: Secondary | ICD-10-CM | POA: Diagnosis not present

## 2024-06-09 DIAGNOSIS — M6281 Muscle weakness (generalized): Secondary | ICD-10-CM | POA: Diagnosis not present

## 2024-06-09 DIAGNOSIS — I69354 Hemiplegia and hemiparesis following cerebral infarction affecting left non-dominant side: Secondary | ICD-10-CM | POA: Diagnosis not present

## 2024-06-09 DIAGNOSIS — R262 Difficulty in walking, not elsewhere classified: Secondary | ICD-10-CM | POA: Diagnosis not present

## 2024-06-14 DIAGNOSIS — R296 Repeated falls: Secondary | ICD-10-CM | POA: Diagnosis not present

## 2024-06-14 DIAGNOSIS — R262 Difficulty in walking, not elsewhere classified: Secondary | ICD-10-CM | POA: Diagnosis not present

## 2024-06-14 DIAGNOSIS — I69354 Hemiplegia and hemiparesis following cerebral infarction affecting left non-dominant side: Secondary | ICD-10-CM | POA: Diagnosis not present

## 2024-06-14 DIAGNOSIS — M6281 Muscle weakness (generalized): Secondary | ICD-10-CM | POA: Diagnosis not present

## 2024-06-16 DIAGNOSIS — R262 Difficulty in walking, not elsewhere classified: Secondary | ICD-10-CM | POA: Diagnosis not present

## 2024-06-16 DIAGNOSIS — R296 Repeated falls: Secondary | ICD-10-CM | POA: Diagnosis not present

## 2024-06-16 DIAGNOSIS — M6281 Muscle weakness (generalized): Secondary | ICD-10-CM | POA: Diagnosis not present

## 2024-06-16 DIAGNOSIS — I69354 Hemiplegia and hemiparesis following cerebral infarction affecting left non-dominant side: Secondary | ICD-10-CM | POA: Diagnosis not present

## 2024-06-19 DIAGNOSIS — M6281 Muscle weakness (generalized): Secondary | ICD-10-CM | POA: Diagnosis not present

## 2024-06-19 DIAGNOSIS — I69354 Hemiplegia and hemiparesis following cerebral infarction affecting left non-dominant side: Secondary | ICD-10-CM | POA: Diagnosis not present

## 2024-06-19 DIAGNOSIS — R296 Repeated falls: Secondary | ICD-10-CM | POA: Diagnosis not present

## 2024-06-19 DIAGNOSIS — R262 Difficulty in walking, not elsewhere classified: Secondary | ICD-10-CM | POA: Diagnosis not present

## 2024-06-21 DIAGNOSIS — I69354 Hemiplegia and hemiparesis following cerebral infarction affecting left non-dominant side: Secondary | ICD-10-CM | POA: Diagnosis not present

## 2024-06-21 DIAGNOSIS — M6281 Muscle weakness (generalized): Secondary | ICD-10-CM | POA: Diagnosis not present

## 2024-06-21 DIAGNOSIS — R296 Repeated falls: Secondary | ICD-10-CM | POA: Diagnosis not present

## 2024-06-21 DIAGNOSIS — R262 Difficulty in walking, not elsewhere classified: Secondary | ICD-10-CM | POA: Diagnosis not present

## 2024-06-26 DIAGNOSIS — Z6830 Body mass index (BMI) 30.0-30.9, adult: Secondary | ICD-10-CM | POA: Diagnosis not present

## 2024-06-26 DIAGNOSIS — R630 Anorexia: Secondary | ICD-10-CM | POA: Diagnosis not present

## 2024-06-26 DIAGNOSIS — E782 Mixed hyperlipidemia: Secondary | ICD-10-CM | POA: Diagnosis not present

## 2024-06-26 DIAGNOSIS — R296 Repeated falls: Secondary | ICD-10-CM | POA: Diagnosis not present

## 2024-06-26 DIAGNOSIS — I63231 Cerebral infarction due to unspecified occlusion or stenosis of right carotid arteries: Secondary | ICD-10-CM | POA: Diagnosis not present

## 2024-06-26 DIAGNOSIS — I10 Essential (primary) hypertension: Secondary | ICD-10-CM | POA: Diagnosis not present

## 2024-06-26 DIAGNOSIS — Z8673 Personal history of transient ischemic attack (TIA), and cerebral infarction without residual deficits: Secondary | ICD-10-CM | POA: Diagnosis not present

## 2024-06-28 DIAGNOSIS — R262 Difficulty in walking, not elsewhere classified: Secondary | ICD-10-CM | POA: Diagnosis not present

## 2024-06-28 DIAGNOSIS — R296 Repeated falls: Secondary | ICD-10-CM | POA: Diagnosis not present

## 2024-06-28 DIAGNOSIS — I69354 Hemiplegia and hemiparesis following cerebral infarction affecting left non-dominant side: Secondary | ICD-10-CM | POA: Diagnosis not present

## 2024-06-28 DIAGNOSIS — M6281 Muscle weakness (generalized): Secondary | ICD-10-CM | POA: Diagnosis not present

## 2024-06-30 DIAGNOSIS — I69354 Hemiplegia and hemiparesis following cerebral infarction affecting left non-dominant side: Secondary | ICD-10-CM | POA: Diagnosis not present

## 2024-06-30 DIAGNOSIS — R262 Difficulty in walking, not elsewhere classified: Secondary | ICD-10-CM | POA: Diagnosis not present

## 2024-06-30 DIAGNOSIS — M6281 Muscle weakness (generalized): Secondary | ICD-10-CM | POA: Diagnosis not present

## 2024-06-30 DIAGNOSIS — R296 Repeated falls: Secondary | ICD-10-CM | POA: Diagnosis not present

## 2024-07-03 DIAGNOSIS — I69354 Hemiplegia and hemiparesis following cerebral infarction affecting left non-dominant side: Secondary | ICD-10-CM | POA: Diagnosis not present

## 2024-07-03 DIAGNOSIS — M6281 Muscle weakness (generalized): Secondary | ICD-10-CM | POA: Diagnosis not present

## 2024-07-03 DIAGNOSIS — R262 Difficulty in walking, not elsewhere classified: Secondary | ICD-10-CM | POA: Diagnosis not present

## 2024-07-03 DIAGNOSIS — R296 Repeated falls: Secondary | ICD-10-CM | POA: Diagnosis not present

## 2024-07-05 DIAGNOSIS — I69354 Hemiplegia and hemiparesis following cerebral infarction affecting left non-dominant side: Secondary | ICD-10-CM | POA: Diagnosis not present

## 2024-07-05 DIAGNOSIS — R296 Repeated falls: Secondary | ICD-10-CM | POA: Diagnosis not present

## 2024-07-05 DIAGNOSIS — R262 Difficulty in walking, not elsewhere classified: Secondary | ICD-10-CM | POA: Diagnosis not present

## 2024-07-05 DIAGNOSIS — M6281 Muscle weakness (generalized): Secondary | ICD-10-CM | POA: Diagnosis not present

## 2024-07-12 DIAGNOSIS — R262 Difficulty in walking, not elsewhere classified: Secondary | ICD-10-CM | POA: Diagnosis not present

## 2024-07-12 DIAGNOSIS — I69354 Hemiplegia and hemiparesis following cerebral infarction affecting left non-dominant side: Secondary | ICD-10-CM | POA: Diagnosis not present

## 2024-07-12 DIAGNOSIS — M6281 Muscle weakness (generalized): Secondary | ICD-10-CM | POA: Diagnosis not present

## 2024-07-12 DIAGNOSIS — R296 Repeated falls: Secondary | ICD-10-CM | POA: Diagnosis not present

## 2024-07-14 DIAGNOSIS — R296 Repeated falls: Secondary | ICD-10-CM | POA: Diagnosis not present

## 2024-07-14 DIAGNOSIS — R262 Difficulty in walking, not elsewhere classified: Secondary | ICD-10-CM | POA: Diagnosis not present

## 2024-07-14 DIAGNOSIS — I69354 Hemiplegia and hemiparesis following cerebral infarction affecting left non-dominant side: Secondary | ICD-10-CM | POA: Diagnosis not present

## 2024-07-14 DIAGNOSIS — M6281 Muscle weakness (generalized): Secondary | ICD-10-CM | POA: Diagnosis not present

## 2024-07-19 DIAGNOSIS — M6281 Muscle weakness (generalized): Secondary | ICD-10-CM | POA: Diagnosis not present

## 2024-07-19 DIAGNOSIS — I69354 Hemiplegia and hemiparesis following cerebral infarction affecting left non-dominant side: Secondary | ICD-10-CM | POA: Diagnosis not present

## 2024-07-19 DIAGNOSIS — R296 Repeated falls: Secondary | ICD-10-CM | POA: Diagnosis not present

## 2024-07-19 DIAGNOSIS — R262 Difficulty in walking, not elsewhere classified: Secondary | ICD-10-CM | POA: Diagnosis not present

## 2024-07-21 DIAGNOSIS — R262 Difficulty in walking, not elsewhere classified: Secondary | ICD-10-CM | POA: Diagnosis not present

## 2024-07-21 DIAGNOSIS — R296 Repeated falls: Secondary | ICD-10-CM | POA: Diagnosis not present

## 2024-07-21 DIAGNOSIS — I69354 Hemiplegia and hemiparesis following cerebral infarction affecting left non-dominant side: Secondary | ICD-10-CM | POA: Diagnosis not present

## 2024-07-21 DIAGNOSIS — M6281 Muscle weakness (generalized): Secondary | ICD-10-CM | POA: Diagnosis not present

## 2024-07-26 DIAGNOSIS — I69354 Hemiplegia and hemiparesis following cerebral infarction affecting left non-dominant side: Secondary | ICD-10-CM | POA: Diagnosis not present

## 2024-07-26 DIAGNOSIS — R296 Repeated falls: Secondary | ICD-10-CM | POA: Diagnosis not present

## 2024-07-26 DIAGNOSIS — M6281 Muscle weakness (generalized): Secondary | ICD-10-CM | POA: Diagnosis not present

## 2024-07-26 DIAGNOSIS — R262 Difficulty in walking, not elsewhere classified: Secondary | ICD-10-CM | POA: Diagnosis not present

## 2024-07-28 DIAGNOSIS — M6281 Muscle weakness (generalized): Secondary | ICD-10-CM | POA: Diagnosis not present

## 2024-07-28 DIAGNOSIS — I69354 Hemiplegia and hemiparesis following cerebral infarction affecting left non-dominant side: Secondary | ICD-10-CM | POA: Diagnosis not present

## 2024-07-28 DIAGNOSIS — R262 Difficulty in walking, not elsewhere classified: Secondary | ICD-10-CM | POA: Diagnosis not present

## 2024-07-28 DIAGNOSIS — R296 Repeated falls: Secondary | ICD-10-CM | POA: Diagnosis not present

## 2024-08-01 DIAGNOSIS — I69354 Hemiplegia and hemiparesis following cerebral infarction affecting left non-dominant side: Secondary | ICD-10-CM | POA: Diagnosis not present

## 2024-08-01 DIAGNOSIS — M6281 Muscle weakness (generalized): Secondary | ICD-10-CM | POA: Diagnosis not present

## 2024-08-01 DIAGNOSIS — R296 Repeated falls: Secondary | ICD-10-CM | POA: Diagnosis not present

## 2024-08-01 DIAGNOSIS — R262 Difficulty in walking, not elsewhere classified: Secondary | ICD-10-CM | POA: Diagnosis not present
